# Patient Record
Sex: Female | Born: 1985 | Hispanic: Yes | Marital: Single | Smoking: Former smoker
Health system: Southern US, Community
[De-identification: ages and names within clinical notes are randomized; demographics above are authoritative.]

## PROBLEM LIST (undated history)

## (undated) ENCOUNTER — Inpatient Hospital Stay (HOSPITAL_COMMUNITY): Payer: Self-pay

## (undated) DIAGNOSIS — D649 Anemia, unspecified: Secondary | ICD-10-CM

## (undated) DIAGNOSIS — N739 Female pelvic inflammatory disease, unspecified: Secondary | ICD-10-CM

## (undated) DIAGNOSIS — A749 Chlamydial infection, unspecified: Secondary | ICD-10-CM

## (undated) DIAGNOSIS — A599 Trichomoniasis, unspecified: Secondary | ICD-10-CM

## (undated) HISTORY — PX: TUBAL LIGATION: SHX77

## (undated) HISTORY — PX: AMPUTATION FINGER: SHX6594

## (undated) HISTORY — PX: CHOLECYSTECTOMY: SHX55

---

## 2007-01-13 ENCOUNTER — Emergency Department (HOSPITAL_COMMUNITY): Admission: EM | Admit: 2007-01-13 | Discharge: 2007-01-13 | Payer: Self-pay | Admitting: *Deleted

## 2008-10-14 ENCOUNTER — Emergency Department (HOSPITAL_COMMUNITY): Admission: EM | Admit: 2008-10-14 | Discharge: 2008-10-14 | Payer: Self-pay | Admitting: Emergency Medicine

## 2009-03-28 ENCOUNTER — Emergency Department (HOSPITAL_COMMUNITY): Admission: EM | Admit: 2009-03-28 | Discharge: 2009-03-28 | Payer: Self-pay | Admitting: Emergency Medicine

## 2009-04-02 ENCOUNTER — Inpatient Hospital Stay (HOSPITAL_COMMUNITY): Admission: AD | Admit: 2009-04-02 | Discharge: 2009-04-02 | Payer: Self-pay | Admitting: Obstetrics & Gynecology

## 2009-04-21 ENCOUNTER — Inpatient Hospital Stay (HOSPITAL_COMMUNITY): Admission: AD | Admit: 2009-04-21 | Discharge: 2009-04-22 | Payer: Self-pay | Admitting: Obstetrics & Gynecology

## 2009-05-20 ENCOUNTER — Inpatient Hospital Stay (HOSPITAL_COMMUNITY): Admission: AD | Admit: 2009-05-20 | Discharge: 2009-05-20 | Payer: Self-pay | Admitting: Obstetrics & Gynecology

## 2009-06-05 ENCOUNTER — Ambulatory Visit: Payer: Self-pay | Admitting: Obstetrics & Gynecology

## 2009-06-05 ENCOUNTER — Encounter (INDEPENDENT_AMBULATORY_CARE_PROVIDER_SITE_OTHER): Payer: Self-pay | Admitting: Family Medicine

## 2009-06-05 ENCOUNTER — Encounter: Payer: Self-pay | Admitting: Family Medicine

## 2009-11-20 ENCOUNTER — Inpatient Hospital Stay (HOSPITAL_COMMUNITY): Admission: AD | Admit: 2009-11-20 | Discharge: 2009-11-20 | Payer: Self-pay | Admitting: Obstetrics & Gynecology

## 2009-11-27 ENCOUNTER — Ambulatory Visit (HOSPITAL_COMMUNITY): Admission: RE | Admit: 2009-11-27 | Discharge: 2009-11-27 | Payer: Self-pay | Admitting: Obstetrics & Gynecology

## 2009-11-29 ENCOUNTER — Inpatient Hospital Stay (HOSPITAL_COMMUNITY): Admission: AD | Admit: 2009-11-29 | Discharge: 2009-11-29 | Payer: Self-pay | Admitting: Obstetrics and Gynecology

## 2009-12-06 ENCOUNTER — Ambulatory Visit (HOSPITAL_COMMUNITY): Admission: RE | Admit: 2009-12-06 | Discharge: 2009-12-06 | Payer: Self-pay | Admitting: Obstetrics & Gynecology

## 2010-01-07 ENCOUNTER — Ambulatory Visit (HOSPITAL_COMMUNITY): Admission: RE | Admit: 2010-01-07 | Discharge: 2010-01-07 | Payer: Self-pay | Admitting: Obstetrics and Gynecology

## 2010-07-22 ENCOUNTER — Inpatient Hospital Stay (HOSPITAL_COMMUNITY)
Admission: AD | Admit: 2010-07-22 | Discharge: 2010-07-22 | Payer: Self-pay | Source: Home / Self Care | Admitting: Obstetrics & Gynecology

## 2010-07-24 ENCOUNTER — Inpatient Hospital Stay (HOSPITAL_COMMUNITY): Admission: AD | Admit: 2010-07-24 | Discharge: 2010-07-27 | Payer: Self-pay | Admitting: Obstetrics and Gynecology

## 2010-11-04 LAB — CBC
HCT: 25.9 % — ABNORMAL LOW (ref 36.0–46.0)
HCT: 27.3 % — ABNORMAL LOW (ref 36.0–46.0)
Hemoglobin: 11.4 g/dL — ABNORMAL LOW (ref 12.0–15.0)
Hemoglobin: 8.6 g/dL — ABNORMAL LOW (ref 12.0–15.0)
Hemoglobin: 9.2 g/dL — ABNORMAL LOW (ref 12.0–15.0)
MCHC: 33.7 g/dL (ref 30.0–36.0)
MCHC: 33.9 g/dL (ref 30.0–36.0)
MCHC: 33.9 g/dL (ref 30.0–36.0)
MCV: 87.1 fL (ref 78.0–100.0)
Platelets: 104 10*3/uL — ABNORMAL LOW (ref 150–400)
Platelets: 120 10*3/uL — ABNORMAL LOW (ref 150–400)
Platelets: 98 10*3/uL — ABNORMAL LOW (ref 150–400)
RBC: 2.89 MIL/uL — ABNORMAL LOW (ref 3.87–5.11)
RDW: 14.7 % (ref 11.5–15.5)
RDW: 14.7 % (ref 11.5–15.5)
RDW: 14.8 % (ref 11.5–15.5)
WBC: 12.8 10*3/uL — ABNORMAL HIGH (ref 4.0–10.5)
WBC: 8.4 10*3/uL (ref 4.0–10.5)

## 2010-11-04 LAB — GC/CHLAMYDIA PROBE AMP, URINE: Chlamydia, Swab/Urine, PCR: NEGATIVE

## 2010-11-12 LAB — HCG, QUANTITATIVE, PREGNANCY
hCG, Beta Chain, Quant, S: 4720 m[IU]/mL — ABNORMAL HIGH (ref <5)
hCG, Beta Chain, Quant, S: 9200 m[IU]/mL — ABNORMAL HIGH (ref <5)

## 2010-11-17 LAB — COMPREHENSIVE METABOLIC PANEL
ALT: 11 U/L (ref 0–35)
AST: 19 U/L (ref 0–37)
Albumin: 2.7 g/dL — ABNORMAL LOW (ref 3.5–5.2)
Alkaline Phosphatase: 131 U/L — ABNORMAL HIGH (ref 39–117)
Chloride: 105 mEq/L (ref 96–112)
GFR calc Af Amer: >60 mL/min (ref >60)
GFR calc non Af Amer: >60 mL/min (ref >60)
Sodium: 135 mEq/L (ref 135–145)
Total Bilirubin: 0.3 mg/dL (ref 0.3–1.2)
Total Protein: 5.6 g/dL — ABNORMAL LOW (ref 6.0–8.3)

## 2010-11-17 LAB — ABO/RH
ABO/RH(D): A POS
ABO/RH(D): A POS

## 2010-11-17 LAB — CBC
HCT: 36.2 % (ref 36.0–46.0)
HCT: 37.8 % (ref 36.0–46.0)
Hemoglobin: 12.3 g/dL (ref 12.0–15.0)
Platelets: 171 10*3/uL (ref 150–400)
RDW: 13.3 % (ref 11.5–15.5)
RDW: 13.8 % (ref 11.5–15.5)
WBC: 7 10*3/uL (ref 4.0–10.5)

## 2010-11-17 LAB — HCG, QUANTITATIVE, PREGNANCY: hCG, Beta Chain, Quant, S: 192 m[IU]/mL — ABNORMAL HIGH (ref <5)

## 2010-11-28 LAB — DIFFERENTIAL
Eosinophils Absolute: 0.1 10*3/uL (ref 0.0–0.7)
Lymphocytes Relative: 22 % (ref 12–46)
Lymphs Abs: 1.8 10*3/uL (ref 0.7–4.0)
Monocytes Relative: 7 % (ref 3–12)
Neutro Abs: 5.6 10*3/uL (ref 1.7–7.7)
Neutrophils Relative %: 70 % (ref 43–77)

## 2010-11-28 LAB — CBC
MCV: 91.7 fL (ref 78.0–100.0)
Platelets: 148 10*3/uL — ABNORMAL LOW (ref 150–400)
RBC: 3.98 MIL/uL (ref 3.87–5.11)
WBC: 8 10*3/uL (ref 4.0–10.5)

## 2010-11-29 LAB — URINALYSIS, ROUTINE W REFLEX MICROSCOPIC
Glucose, UA: NEGATIVE mg/dL
Protein, ur: 30 mg/dL — AB
Specific Gravity, Urine: 1.021 (ref 1.005–1.030)

## 2010-11-29 LAB — URINE MICROSCOPIC-ADD ON

## 2010-11-29 LAB — WET PREP, GENITAL

## 2010-11-29 LAB — GC/CHLAMYDIA PROBE AMP, GENITAL: GC Probe Amp, Genital: NEGATIVE

## 2010-11-30 LAB — URINALYSIS, ROUTINE W REFLEX MICROSCOPIC
Glucose, UA: NEGATIVE mg/dL
Protein, ur: NEGATIVE mg/dL
Specific Gravity, Urine: >1.03 — ABNORMAL HIGH (ref 1.005–1.030)
Urobilinogen, UA: 1 mg/dL (ref 0.0–1.0)

## 2010-11-30 LAB — CBC
Hemoglobin: 13.8 g/dL (ref 12.0–15.0)
MCHC: 33.8 g/dL (ref 30.0–36.0)
MCV: 89.7 fL (ref 78.0–100.0)
RBC: 4.53 MIL/uL (ref 3.87–5.11)

## 2010-11-30 LAB — WET PREP, GENITAL: Yeast Wet Prep HPF POC: NONE SEEN

## 2010-11-30 LAB — URINE MICROSCOPIC-ADD ON

## 2011-10-15 ENCOUNTER — Emergency Department (INDEPENDENT_AMBULATORY_CARE_PROVIDER_SITE_OTHER): Payer: Medicaid Other

## 2011-10-15 ENCOUNTER — Emergency Department (HOSPITAL_BASED_OUTPATIENT_CLINIC_OR_DEPARTMENT_OTHER)
Admission: EM | Admit: 2011-10-15 | Discharge: 2011-10-15 | Disposition: A | Discharge status: Home Health | Payer: Medicaid Other | Attending: Emergency Medicine | Admitting: Emergency Medicine

## 2011-10-15 ENCOUNTER — Encounter (HOSPITAL_BASED_OUTPATIENT_CLINIC_OR_DEPARTMENT_OTHER): Payer: Self-pay

## 2011-10-15 DIAGNOSIS — N9489 Other specified conditions associated with female genital organs and menstrual cycle: Secondary | ICD-10-CM | POA: Insufficient documentation

## 2011-10-15 DIAGNOSIS — F17200 Nicotine dependence, unspecified, uncomplicated: Secondary | ICD-10-CM | POA: Insufficient documentation

## 2011-10-15 DIAGNOSIS — N949 Unspecified condition associated with female genital organs and menstrual cycle: Secondary | ICD-10-CM

## 2011-10-15 DIAGNOSIS — R109 Unspecified abdominal pain: Secondary | ICD-10-CM | POA: Insufficient documentation

## 2011-10-15 DIAGNOSIS — R112 Nausea with vomiting, unspecified: Secondary | ICD-10-CM | POA: Insufficient documentation

## 2011-10-15 DIAGNOSIS — N83209 Unspecified ovarian cyst, unspecified side: Secondary | ICD-10-CM

## 2011-10-15 DIAGNOSIS — Z3201 Encounter for pregnancy test, result positive: Secondary | ICD-10-CM | POA: Insufficient documentation

## 2011-10-15 LAB — HCG, QUANTITATIVE, PREGNANCY: hCG, Beta Chain, Quant, S: 76 m[IU]/mL — ABNORMAL HIGH (ref <5)

## 2011-10-15 LAB — URINALYSIS, ROUTINE W REFLEX MICROSCOPIC
Hgb urine dipstick: NEGATIVE
Leukocytes, UA: NEGATIVE
Nitrite: NEGATIVE
Protein, ur: NEGATIVE mg/dL
Urobilinogen, UA: 1 mg/dL (ref 0.0–1.0)

## 2011-10-15 LAB — WET PREP, GENITAL

## 2011-10-15 LAB — PREGNANCY, URINE: Preg Test, Ur: POSITIVE — AB

## 2011-10-15 NOTE — ED Notes (Signed)
Pt reports she had IUD removed in January and hasnt had a period since then and isnt on any other birth control. Took a home pregnancy test yesterday and reports it looks like there is a faint positive

## 2011-10-15 NOTE — ED Provider Notes (Addendum)
History     CSN: [id number]  Arrival date & time 10/15/11  1242   First MD Initiated Contact with Patient 10/15/11 1309      Chief Complaint  Patient presents with  . Pelvic Pain    (Consider location/radiation/quality/duration/timing/severity/associated sxs/prior treatment) Patient is a 26 y.o. female presenting with pelvic pain. The history is provided by the patient.  Pelvic Pain This is a new (2 weeks ago) problem. The current episode started more than 1 week ago. The problem occurs constantly. The problem has been gradually worsening. Associated symptoms include abdominal pain. Pertinent negatives include no headaches. Associated symptoms comments: No fever but intermittent vomiting usually after eating. No diarrhea, vaginal discharge.. The symptoms are aggravated by nothing. The symptoms are relieved by nothing. She has tried nothing for the symptoms.    History reviewed. No pertinent past medical history.  History reviewed. No pertinent past surgical history.  No family history on file.  History  Substance Use Topics  . Smoking status: Current Some Day Smoker  . Smokeless tobacco: Not on file  . Alcohol Use: No    OB History    Grav Para Term Preterm Abortions TAB SAB Ect Mult Living                  Review of Systems  Constitutional: Negative for fever.  Gastrointestinal: Positive for nausea, vomiting and abdominal pain. Negative for constipation.  Genitourinary: Positive for pelvic pain. Negative for dysuria, urgency, vaginal bleeding and vaginal discharge.  Neurological: Negative for headaches.  All other systems reviewed and are negative.    Allergies  Review of patient's allergies indicates no known allergies.  Home Medications  No current outpatient prescriptions on file.  BP 119/64  Pulse 68  Temp(Src) 98.3 F (36.8 C) (Oral)  Resp 16  Ht 5\' 1"  (1.549 m)  Wt 209 lb (94.802 kg)  BMI 39.49 kg/m2  SpO2 100%  LMP 09/05/2011  Physical Exam    Nursing note and vitals reviewed. Constitutional: She is oriented to person, place, and time. She appears well-developed and well-nourished. No distress.  HENT:  Head: Normocephalic and atraumatic.  Mouth/Throat: Oropharynx is clear and moist.  Eyes: Conjunctivae and EOM are normal. Pupils are equal, round, and reactive to light.  Neck: Normal range of motion. Neck supple.  Cardiovascular: Normal rate, regular rhythm and intact distal pulses.   No murmur heard. Pulmonary/Chest: Effort normal and breath sounds normal. No respiratory distress. She has no wheezes. She has no rales.  Abdominal: Soft. She exhibits no distension. There is tenderness. There is no rebound and no guarding.    Genitourinary: Vagina normal and uterus normal. Uterus is not tender. Cervix exhibits no motion tenderness, no discharge and no friability. Right adnexum displays no mass, no tenderness and no fullness. Left adnexum displays no mass, no tenderness and no fullness.  Musculoskeletal: Normal range of motion. She exhibits no edema and no tenderness.  Neurological: She is alert and oriented to person, place, and time.  Skin: Skin is warm and dry. No rash noted. No erythema.  Psychiatric: She has a normal mood and affect. Her behavior is normal.    ED Course  Procedures (including critical care time)  Labs Reviewed  PREGNANCY, URINE - Abnormal; Notable for the following:    Preg Test, Ur POSITIVE (*)    All other components within normal limits  HCG, QUANTITATIVE, PREGNANCY - Abnormal; Notable for the following:    hCG, Beta Chain, Quant, S 76 (*)  All other components within normal limits  WET PREP, GENITAL - Abnormal; Notable for the following:    Clue Cells Wet Prep HPF POC MODERATE (*)    WBC, Wet Prep HPF POC MODERATE (*)    All other components within normal limits  URINALYSIS, ROUTINE W REFLEX MICROSCOPIC  GC/CHLAMYDIA PROBE AMP, GENITAL   No results found.   No diagnosis found.    MDM    Patient with 2 weeks of pelvic pain and nausea. Patient had her IUD removed in January and had one period mid-January. I had one in February. She is having unprotected sex but denies any vaginal discharge, new partners, or urinary symptoms. Pelvic exam is normal. May be within normal limits. UPT positive. Because unclear due to recent IUD removal how pregnant the patient is. HCG and pelvic ultrasound ordered.  Labs show early pregnancy. Transvaginal ultrasound was IVP at this time however there is also a cyst in the ovary but unclear whether it is an ectopic pregnancy or a luteal cyst.. No sign of UTI or bacterial infection at this time. Will have her follow up with OB for repeat Quant and ultrasound.       Gwyneth Sprout, MD 10/15/11 1541  Gwyneth Sprout, MD 10/15/11 1559

## 2011-10-15 NOTE — Discharge Instructions (Signed)
Human Chorionic Gonadotropin (hCG) This is a test to confirm and monitor pregnancy or to diagnose trophoblastic disease or germ cell tumors. As early as 10 days after a missed menstrual period (some methods can detect hCG even earlier, at one week after conception) or if your caregiver thinks that your symptoms suggest ectopic pregnancy, a failing pregnancy, trophoblastic disease, or germ cell tumors. hCG is a protein produced in the placenta of a pregnant woman. A pregnancy test is a specific blood or urine test that can detect hCG and confirm pregnancy. This hormone is able to be detected 10 days after a missed menstrual period, the time period when the fertilized egg is implanted in the woman's uterus. With some methods, hCG can be detected even earlier, at one week after conception.  During the early weeks of pregnancy, hCG is important in maintaining function of the corpus luteum (the mass of cells that forms from a mature egg). Production of hCG increases steadily during the first trimester (8-10 weeks), peaking around the 10th week after the last menstrual cycle. Levels then fall slowly during the remainder of the pregnancy. hCG is no longer detectable within a few weeks after delivery. hCG is also produced by some germ cell tumors and increased levels are seen in trophoblastic disease. SAMPLE COLLECTION hCG is commonly detected in urine. The preferred specimen is a random urine sample collected first thing in the morning. hCG can also be measured in blood drawn from a vein in the arm. NORMAL FINDINGS Qualitative:negative in non-pregnant women; positive in pregnancy Quantitative:   Gestation less than 1 week: 5-50 Whole HCG (milli-international units/mL)   Gestation of 2 weeks: 50-500 Whole HCG (milli-international units/mL)   Gestation of 3 weeks: 100-10,000 Whole HCG (milli-international units/mL)   Gestation of 4 weeks: 1,000-30,000 Whole HCG (milli-international units/mL)   Gestation of  5 weeks 3,500-115,000 Whole HCG (milli-international units/mL)   Gestation of 6-8 weeks: 12,000-270,000 Whole HCG (milli-international units/mL)   Gestation of 12 weeks: 15,000-220,000 Whole HCG (milli-international units/mL)   Males and non-pregnant females: less than 5 Whole HCG (milli-international units/mL)  Beta subunit: depends on the method and test used Ranges for normal findings may vary among different laboratories and hospitals. You should always check with your doctor after having lab work or other tests done to discuss the meaning of your test results and whether your values are considered within normal limits. MEANING OF TEST  Your caregiver will go over the test results with you and discuss the importance and meaning of your results, as well as treatment options and the need for additional tests if necessary. OBTAINING THE TEST RESULTS It is your responsibility to obtain your test results. Ask the lab or department performing the test when and how you will get your results. Document Released: 09/11/2004 Document Revised: 04/22/2011 Document Reviewed: 07/21/2008 [facility] Patient Information 2012 [place], [place].Prenatal Care  WHAT IS PRENATAL CARE?  Prenatal care means health care during your pregnancy, before your baby is born. Take care of yourself and your baby by:   Getting early prenatal care. If you know you are pregnant, or think you might be pregnant, call your caregiver as soon as possible. Schedule a visit for a general/prenatal examination.   Getting regular prenatal care. Follow your caregiver's schedule for blood and other necessary tests. Do not miss appointments.   Do everything you can to keep yourself and your baby healthy during your pregnancy.   Prenatal care should include evaluation of medical, dietary, educational, psychological, and  social needs for the couple and the medical, surgical, and genetic history of the family of the mother and father.    Discuss with your caregiver:   Your medicines, prescription, over-the-counter, and herbal medicines.   Substance abuse, alcohol, smoking, and illegal drugs.   Domestic abuse and violence, if present.   Your immunizations.   Nutrition and diet.   Exercising.   Environment and occupational hazards, at home and at work.   History of sexually transmitted disease, both you and your partner.   Previous pregnancies.  WHY IS PRENATAL CARE SO IMPORTANT?  By seeing you regularly, your caregiver has the chance to find problems early, so that they can be treated as soon as possible. Other problems might be prevented. Many studies have shown that early and regular prenatal care is important for the health of both mothers and their babies.  I AM THINKING ABOUT GETTING PREGNANT. HOW CAN I TAKE CARE OF MYSELF?  Taking care of yourself before you get pregnant helps you to have a healthy pregnancy. It also lowers your chances of having a baby born with a birth defect. Here are ways to take care of yourself before you get pregnant:   Eat healthy foods, exercise regularly (30 minutes per day for most days of the week is best), and get enough rest and sleep. Talk to your caregiver about what kinds of foods and exercises are best for you.   Take 400 micrograms (mcg) of folic acid (one of the B vitamins) every day. The best way to do this is to take a daily multivitamin pill that contains this amount of folic acid. Getting enough of the synthetic (manufactured) form of folic acid every day before you get pregnant and during early pregnancy can help prevent certain birth defects. Many breakfast cereals and other grain products have folic acid added to them, but only certain cereals contain 400 mcg of folic acid per serving. Check the label on your multivitamin or cereal to find the amount of folic acid in the food.   See your caregiver for a complete check up before getting pregnant. Make sure that you have  had all your immunization shots, especially for rubella ([place] measles). Rubella can cause serious birth defects. Chickenpox is another illness you want to avoid during pregnancy. If you have had chickenpox and rubella in the past, you should be immune to them.   Tell your caregiver about any prescription or non-prescription medicines (including herbal remedies) you are taking. Some medicines are not safe to take during pregnancy.   Stop smoking cigarettes, drinking alcohol, or taking illegal drugs. Ask your caregiver for help, if you need it. You can also get help with alcohol and drugs by talking with a member of your faith community, a counselor, or a trusted friend.   Discuss and treat any medical, social, or psychological problems before getting pregnant.   Discuss any history of genetic problems in the mother, father, and their families. Do genetic testing before getting pregnant, when possible.   Discuss any physical or emotional abuse with your caregiver.   Discuss with your caregiver if you might be exposed to harmful chemicals on your job or where you live.   Discuss with your caregiver if you think your job or the hours you work may be harmful and should be changed.   The father should be involved with the decision making and with all aspects of the pregnancy, labor, and delivery.   If you have medical  insurance, make sure you are covered for pregnancy.  I JUST FOUND OUT THAT I AM PREGNANT. HOW CAN I TAKE CARE OF MYSELF?  Here are ways to take care of yourself and the precious new life growing inside you:   Continue taking your multivitamin with 400 micrograms (mcg) of folic acid every day.   Get early and regular prenatal care. It does not matter if this is your first pregnancy or if you already have children. It is very important to see a caregiver during your pregnancy. Your caregiver will check at each visit to make sure that you and the baby are healthy. If there are any  problems, action can be taken right away to help you and the baby.   Eat a healthy diet that includes:   Fruits.   Vegetables.   Foods low in saturated fat.   Grains.   Calcium-rich foods.   Drink 6 to 8 glasses of liquids a day.   Unless your caregiver tells you not to, try to be physically active for 30 minutes, most days of the week. If you are pressed for time, you can get your activity in through 10 minute segments, three times a day.   If you smoke, drink alcohol, or use drugs, STOP. These can cause long-term damage to your baby. Talk with your caregiver about steps to take to stop smoking. Talk with a member of your faith community, a counselor, a trusted friend, or your caregiver if you are concerned about your alcohol or drug use.   Ask your caregiver before taking any medicine, even over-the-counter medicines. Some medicines are not safe to take during pregnancy.   Get plenty of rest and sleep.   Avoid hot tubs and saunas during pregnancy.   Do not have X-rays taken, unless absolutely necessary and with the recommendation of your caregiver. A lead shield can be placed on your abdomen, to protect the baby when X-rays are taken in other parts of the body.   Do not empty the cat litter when you are pregnant. It may contain a parasite that causes an infection called toxoplasmosis, which can cause birth defects. Also, use gloves when working in garden areas used by cats.   Do not eat uncooked or undercooked cheese, meats, or fish.   Stay away from toxic chemicals like:   Insecticides.   Solvents (some cleaners or paint thinners).   Lead.   Mercury.   Sexual relations may continue until the end of the pregnancy, unless you have a medical problem or there is a problem with the pregnancy and your caregiver tells you not to.   Do not wear high heel shoes, especially during the second half of the pregnancy. You can lose your balance and fall.   Do not take long trips,  unless absolutely necessary. Be sure to see your caregiver before going on the trip.   Do not sit in one position for more than 2 hours, when on a trip.   Take a copy of your medical records when going on a trip.   Know where there is a hospital in the city you are visiting, in case of an emergency.   Most dangerous household products will have pregnancy warnings on their labels. Ask your caregiver about products if you are unsure.   Limit or eliminate your caffeine intake from coffee, tea, sodas, medicines, and chocolate.   Many women continue working through pregnancy. Staying active might help you stay healthier. If you have  a question about the safety or the hours you work at your particular job, talk with your caregiver.   Get informed:   Read books.   Watch videos.   Go to childbirth classes for you and the father.   Talk with experienced moms.   Ask your caregiver about childbirth education classes for you and your partner. Classes can help you and your partner prepare for the birth of your baby.   Ask about a pediatrician (baby doctor) and methods and pain medicine for labor, delivery, and possible Cesarean delivery (C-section).  I AM NOT THINKING ABOUT GETTING PREGNANT RIGHT NOW, BUT HEARD THAT ALL WOMEN SHOULD TAKE FOLIC ACID EVERY DAY?  All women of childbearing age, with even a remote chance of getting pregnant, should try to make sure they get enough folic acid. Many pregnancies are not planned. Many women do not know they are actually pregnant early in their pregnancies, and certain birth defects happen in the very early part of pregnancy. Taking 400 micrograms (mcg) of folic acid every day will help prevent certain birth defects that happen in the early part of pregnancy. If a woman begins taking vitamin pills in the second or third month of pregnancy, it may be too late to prevent birth defects. Folic acid may also have other health benefits for women, besides preventing  birth defects.  HOW OFTEN SHOULD I SEE MY CAREGIVER DURING PREGNANCY?  Your caregiver will give you a schedule for your prenatal visits. You will have visits more often as you get closer to the end of your pregnancy. An average pregnancy lasts about 40 weeks.  A typical schedule includes visiting your caregiver:   About once each month, during your first 6 months of pregnancy.   Every 2 weeks, during the next 2 months.   Weekly in the last month, until the delivery date.  Your caregiver will probably want to see you more often if:  You are over 35.   Your pregnancy is high risk, because you have certain health problems or problems with the pregnancy, such as:   Diabetes.   High blood pressure.   The baby is not growing on schedule, according to the dates of the pregnancy.  Your caregiver will do special tests, to make sure you and the baby are not having any serious problems. WHAT HAPPENS DURING PRENATAL VISITS?   At your first prenatal visit, your caregiver will talk to you about you and your partner's health history and your family's health history, and will do a physical exam.   On your first visit, a physical exam will include checks of your blood pressure, height and weight, and an exam of your pelvic organs. Your caregiver will do a Pap test if you have not had one recently, and will do cultures of your cervix to make sure there is no infection.   At each visit, there will be tests of your blood, urine, blood pressure, weight, and checking the progress of the baby.   Your caregiver will be able to tell you when to expect that your baby will be born.   Each visit is also a chance for you to learn about staying healthy during pregnancy and for asking questions.   Discuss whether you will be breastfeeding.   At your later prenatal visits, your caregiver will check how you are doing and how the baby is developing. You may have a number of tests done as your pregnancy progresses.    Ultrasound exams  are often used to check on the baby's growth and health.   You may have more urine and blood tests, as well as special tests, if needed. These may include amniocentesis (examine fluid in the pregnancy sac), stress tests (check how baby responds to contractions), biophysical profile (measures fetus well-being). Your caregiver will explain the tests and why they are necessary.  I AM IN MY LATE THIRTIES, AND I WANT TO HAVE A CHILD NOW. SHOULD I DO ANYTHING SPECIAL?  As you get older, there is more chance of having a medical problem (high blood pressure), pregnancy problem (preeclampsia, problems with the placenta), miscarriage, or a baby born with a birth defect. However, most women in their late thirties and early forties have healthy babies. See your caregiver on a regular basis before you get pregnant and be sure to go for exams throughout your pregnancy. Your caregiver probably will want to do some special tests to check on you and your baby's health when you are pregnant.  Women today are often delaying having children until later in life, when they are in their thirties and forties. While many women in their thirties and forties have no difficulty getting pregnant, fertility does decline with age. For women over 40 who cannot get pregnant after 6 months of trying, it is recommended that they see their caregiver for a fertility evaluation. It is not uncommon to have trouble becoming pregnant or experience infertility (inability to become pregnant after trying for one year). If you think that you or your partner may be infertile, you can discuss this with your caregiver. He or she can recommend treatments such as drugs, surgery, or assisted reproductive technology.  Document Released: 08/13/2003 Document Revised: 04/22/2011 Document Reviewed: 07/10/2009 [facility] Patient Information 2012 [place], [place].

## 2011-10-15 NOTE — ED Notes (Signed)
Report received from Antony Odea, RN care assumed.  Pt is in ultrasound.

## 2011-10-15 NOTE — ED Notes (Signed)
C/o n/v, pelvic pain x 2 weeks-denies vaginal d/c and urinary s/s

## 2011-11-02 ENCOUNTER — Encounter (HOSPITAL_BASED_OUTPATIENT_CLINIC_OR_DEPARTMENT_OTHER): Payer: Self-pay

## 2011-11-02 ENCOUNTER — Inpatient Hospital Stay (HOSPITAL_BASED_OUTPATIENT_CLINIC_OR_DEPARTMENT_OTHER)
Admission: EM | Admit: 2011-11-02 | Discharge: 2011-11-03 | Disposition: A | Discharge status: Home / Self Care | Payer: Medicaid Other | Attending: Obstetrics & Gynecology | Admitting: Obstetrics & Gynecology

## 2011-11-02 DIAGNOSIS — Z3490 Encounter for supervision of normal pregnancy, unspecified, unspecified trimester: Secondary | ICD-10-CM

## 2011-11-02 DIAGNOSIS — O209 Hemorrhage in early pregnancy, unspecified: Secondary | ICD-10-CM

## 2011-11-02 DIAGNOSIS — Z331 Pregnant state, incidental: Secondary | ICD-10-CM

## 2011-11-02 DIAGNOSIS — R1032 Left lower quadrant pain: Secondary | ICD-10-CM

## 2011-11-02 HISTORY — DX: Anemia, unspecified: D64.9

## 2011-11-02 LAB — CBC
HCT: 33.1 % — ABNORMAL LOW (ref 36.0–46.0)
Hemoglobin: 11.5 g/dL — ABNORMAL LOW (ref 12.0–15.0)
MCH: 29.8 pg (ref 26.0–34.0)
MCHC: 34.7 g/dL (ref 30.0–36.0)
RDW: 12.5 % (ref 11.5–15.5)

## 2011-11-02 LAB — HCG, QUANTITATIVE, PREGNANCY: hCG, Beta Chain, Quant, S: 39634 m[IU]/mL — ABNORMAL HIGH (ref <5)

## 2011-11-02 LAB — URINALYSIS, ROUTINE W REFLEX MICROSCOPIC
Glucose, UA: NEGATIVE mg/dL
Leukocytes, UA: NEGATIVE
Protein, ur: NEGATIVE mg/dL

## 2011-11-02 MED ORDER — SODIUM CHLORIDE 0.9 % IV BOLUS (SEPSIS)
500.0000 mL | Freq: Once | INTRAVENOUS | Status: AC
  Administered 2011-11-02: via INTRAVENOUS

## 2011-11-02 NOTE — ED Notes (Signed)
Pelvic cart to bedside 

## 2011-11-02 NOTE — ED Notes (Signed)
Warm blanket given

## 2011-11-02 NOTE — ED Notes (Signed)
Pt reminded about NPO status

## 2011-11-02 NOTE — ED Notes (Signed)
Left lower pelvic pain-x 3 days-was seen here and advised pregnant

## 2011-11-02 NOTE — ED Provider Notes (Signed)
This chart was scribed for Molly Gaskins, MD by Wallis Mart. The patient was seen in room MH02/MH02 and the patient's care was started at 10:45 PM.    CSN: [id number]  Arrival date & time 11/02/11  2127   First MD Initiated Contact with Patient 11/02/11 2244      Chief Complaint  Patient presents with  . Pelvic Pain  . Routine Prenatal Visit     HPI  Pt seen at 10:45 PM Molly Mcknight is a 26 y.o. female who presents to the Emergency Department complaining of sudden onset, intermittent, gradually worsening, moderate left sided abdominal pain onset several days ago. Pt is pregnant but not sure how far along. Pain radiates nowhere and pt describes pain as burning and stinging. Pt takes tylenol w/ mild relief. Denies vaginal bleeding, dysuria, coughing, SOB, vomiting, problems w/ BM. Denies any abd surgeries. There are no other associated symptoms and no other alleviating or aggravating factors.  This is pt's third pregnancy, G3P1 LMP 09/05/2011 Past Medical History  Diagnosis Date  . Pregnant state, incidental     History reviewed. No pertinent past surgical history.  No family history on file.  History  Substance Use Topics  . Smoking status: Current Some Day Smoker  . Smokeless tobacco: Not on file  . Alcohol Use: No    OB History    Grav Para Term Preterm Abortions TAB SAB Ect Mult Living   3 1 1  1  1          Review of Systems  Constitutional: Negative for fever.  HENT: Negative for rhinorrhea.   Eyes: Negative for pain.  Respiratory: Negative for cough and shortness of breath.   Cardiovascular: Negative for chest pain.  Gastrointestinal: Positive for abdominal pain. Negative for nausea, vomiting and diarrhea.  Genitourinary: Negative for dysuria and vaginal bleeding.  Musculoskeletal: Negative for back pain.  Skin: Negative for rash.  Neurological: Negative for weakness and headaches.  All other systems reviewed and are negative.    Allergies    Review of patient's allergies indicates no known allergies.  Home Medications   Current Outpatient Rx  Name Route Sig Dispense Refill  . PRENATAL MULTIVITAMIN CH Oral Take 1 tablet by mouth daily.      BP 120/61  Pulse 65  Temp(Src) 98.1 F (36.7 C) (Oral)  Resp 16  Ht 5\' 2"  (1.575 m)  Wt 220 lb (99.791 kg)  BMI 40.24 kg/m2  SpO2 100%  LMP 09/05/2011  Physical Exam CONSTITUTIONAL: Well developed/well nourished HEAD AND FACE: Normocephalic/atraumatic EYES: EOMI/PERRL ENMT: Mucous membranes moist NECK: supple no meningeal signs CV: S1/S2 noted, no murmurs/rubs/gallops noted LUNGS: Lungs are clear to auscultation bilaterally, no apparent distress ABDOMEN: soft, nontender, no rebound or guarding GU:no cva tenderness No cmt Cervical os closed.  No vaginal bleeding.  No adnexal tenderness/mass noted Chaperone present during exam NEURO: Pt is awake/alert, moves all extremitiesx4 EXTREMITIES: pulses normal, full ROM SKIN: warm, color normal PSYCH: no abnormalities of mood noted  ED Course  Procedures  DIAGNOSTIC STUDIES: Oxygen Saturation is 100% on room air, normal by my interpretation.    COORDINATION OF CARE:     Labs Reviewed  URINALYSIS, ROUTINE W REFLEX MICROSCOPIC  CBC  TYPE AND SCREEN  HCG, QUANTITATIVE, PREGNANCY   11:00 PM Last seen in ED in February, had inconclusive [place] at that time with low quant Has not seen GYN Since then Now has pelvic pain, no vag bleeding  11:41 PM Unable to visualize  pregnancy by TA ultrasound Will need transfer to [facility] hospital to evaluate for ectopic Pt stable  11:51 PM D/w dr legget at [facility] Will accept in transfer to [facility] for ultrasound     MDM  Nursing notes reviewed and considered in documentation All labs/vitals reviewed and considered Previous records reviewed and considered    I personally performed the services described in this documentation, which was scribed in my presence. The recorded  information has been reviewed and considered.        Molly Gaskins, MD 11/02/11 2352

## 2011-11-02 NOTE — ED Notes (Signed)
MD at bedside. 

## 2011-11-03 ENCOUNTER — Encounter (HOSPITAL_COMMUNITY): Payer: Self-pay | Admitting: *Deleted

## 2011-11-03 ENCOUNTER — Inpatient Hospital Stay (HOSPITAL_COMMUNITY): Payer: Medicaid Other

## 2011-11-03 DIAGNOSIS — R109 Unspecified abdominal pain: Secondary | ICD-10-CM

## 2011-11-03 NOTE — Discharge Instructions (Signed)
ABCs of Pregnancy A Antepartum care is very important. Be sure you see your doctor and get prenatal care as soon as you think you are pregnant. At this time, you will be tested for infection, genetic abnormalities and potential problems with you and the pregnancy. This is the time to discuss diet, exercise, work, medications, labor, pain medication during labor and the possibility of a cesarean delivery. Ask any questions that may concern you. It is important to see your doctor regularly throughout your pregnancy. Avoid exposure to toxic substances and chemicals - such as cleaning solvents, lead and mercury, some insecticides, and paint. Pregnant women should avoid exposure to paint fumes, and fumes that cause you to feel ill, dizzy or faint. When possible, it is a good idea to have a pre-pregnancy consultation with your caregiver to begin some important recommendations your caregiver suggests such as, taking folic acid, exercising, quitting smoking, avoiding alcoholic beverages, etc. B Breastfeeding is the healthiest choice for both you and your baby. It has many nutritional benefits for the baby and health benefits for the mother. It also creates a very tight and loving bond between the baby and mother. Talk to your doctor, your family and friends, and your employer about how you choose to feed your baby and how they can support you in your decision. Not all birth defects can be prevented, but a woman can take actions that may increase her chance of having a healthy baby. Many birth defects happen very early in pregnancy, sometimes before a woman even knows she is pregnant. Birth defects or abnormalities of any child in your or the father's family should be discussed with your caregiver. Get a good support bra as your breast size changes. Wear it especially when you exercise and when nursing.  C Celebrate the news of your pregnancy with the your spouse/father and family. Childbirth classes are helpful to  take for you and the spouse/father because it helps to understand what happens during the pregnancy, labor and delivery. Cesarean delivery should be discussed with your doctor so you are prepared for that possibility. The pros and cons of circumcision if it is a boy, should be discussed with your pediatrician. Cigarette smoking during pregnancy can result in low birth weight babies. It has been associated with infertility, miscarriages, tubal pregnancies, infant death (mortality) and poor health (morbidity) in childhood. Additionally, cigarette smoking may cause long-term learning disabilities. If you smoke, you should try to quit before getting pregnant and not smoke during the pregnancy. Secondary smoke may also harm a mother and her developing baby. It is a good idea to ask people to stop smoking around you during your pregnancy and after the baby is born. Extra calcium is necessary when you are pregnant and is found in your prenatal vitamin, in dairy products, green leafy vegetables and in calcium supplements. D A healthy diet according to your current weight and height, along with vitamins and mineral supplements should be discussed with your caregiver. Domestic abuse or violence should be made known to your doctor right away to get the situation corrected. Drink more water when you exercise to keep hydrated. Discomfort of your back and legs usually develops and progresses from the middle of the second trimester through to delivery of the baby. This is because of the enlarging baby and uterus, which may also affect your balance. Do not take illegal drugs. Illegal drugs can seriously harm the baby and you. Drink extra fluids (water is best) throughout pregnancy to help   your body keep up with the increases in your blood volume. Drink at least 6 to 8 glasses of water, fruit juice, or milk each day. A good way to know you are drinking enough fluid is when your urine looks almost like clear water or is very light  yellow.  E Eat healthy to get the nutrients you and your unborn baby need. Your meals should include the five basic food groups. Exercise (30 minutes of light to moderate exercise a day) is important and encouraged during pregnancy, if there are no medical problems or problems with the pregnancy. Exercise that causes discomfort or dizziness should be stopped and reported to your caregiver. Emotions during pregnancy can change from being ecstatic to depression and should be understood by you, your partner and your family. F Fetal screening with ultrasound, amniocentesis and monitoring during pregnancy and labor is common and sometimes necessary. Take 400 micrograms of folic acid daily both before, when possible, and during the first few months of pregnancy to reduce the risk of birth defects of the brain and spine. All women who could possibly become pregnant should take a vitamin with folic acid, every day. It is also important to eat a healthy diet with fortified foods (enriched grain products, including cereals, rice, breads, and pastas) and foods with natural sources of folate (orange juice, green leafy vegetables, beans, peanuts, broccoli, asparagus, peas, and lentils). The father should be involved with all aspects of the pregnancy including, the prenatal care, childbirth classes, labor, delivery, and postpartum time. Fathers may also have emotional concerns about being a father, financial needs, and raising a family. G Genetic testing should be done appropriately. It is important to know your family and the father's history. If there have been problems with pregnancies or birth defects in your family, report these to your doctor. Also, genetic counselors can talk with you about the information you might need in making decisions about having a family. You can call a major medical center in your area for help in finding a board-certified genetic counselor. Genetic testing and counseling should be done  before pregnancy when possible, especially if there is a history of problems in the mother's or father's family. Certain ethnic backgrounds are more at risk for genetic defects. H Get familiar with the hospital where you will be having your baby. Get to know how long it takes to get there, the labor and delivery area, and the hospital procedures. Be sure your medical insurance is accepted there. Get your home ready for the baby including, clothes, the baby's room (when possible), furniture and car seat. Hand washing is important throughout the day, especially after handling raw meat and poultry, changing the baby's diaper or using the bathroom. This can help prevent the spread of many bacteria and viruses that cause infection. Your hair may become dry and thinner, but will return to normal a few weeks after the baby is born. Heartburn is a common problem that can be treated by taking antacids recommended by your caregiver, eating smaller meals 5 or 6 times a day, not drinking liquids when eating, drinking between meals and raising the head of your bed 2 to 3 inches. I Insurance to cover you, the baby, doctor and hospital should be reviewed so that you will be prepared to pay any costs not covered by your insurance plan. If you do not have medical insurance, there are usually clinics and services available for you in your community. Take 30 milligrams of iron during   your pregnancy as prescribed by your doctor to reduce the risk of low red blood cells (anemia) later in pregnancy. All women of childbearing age should eat a diet rich in iron. J There should be a joint effort for the mother, father and any other children to adapt to the pregnancy financially, emotionally, and psychologically during the pregnancy. Join a support group for moms-to-be. Or, join a class on parenting or childbirth. Have the family participate when possible. K Know your limits. Let your caregiver know if you experience any of the  following:   Pain of any kind.   Strong cramps.   You develop a lot of weight in a short period of time (5 pounds in 3 to 5 days).   Vaginal bleeding, leaking of amniotic fluid.   Headache, vision problems.   Dizziness, fainting, shortness of breath.   Chest pain.   Fever of 102 F (38.9 C) or higher.   Gush of clear fluid from your vagina.   Painful urination.   Domestic violence.   Irregular heartbeat (palpitations).   Rapid beating of the heart (tachycardia).   Constant feeling sick to your stomach (nauseous) and vomiting.   Trouble walking, fluid retention (edema).   Muscle weakness.   If your baby has decreased activity.   Persistent diarrhea.   Abnormal vaginal discharge.   Uterine contractions at 20-minute intervals.   Back pain that travels down your leg.  L Learn and practice that what you eat and drink should be in moderation and healthy for you and your baby. Legal drugs such as alcohol and caffeine are important issues for pregnant women. There is no safe amount of alcohol a woman can drink while pregnant. Fetal alcohol syndrome, a disorder characterized by growth retardation, facial abnormalities, and central nervous system dysfunction, is caused by a woman's use of alcohol during pregnancy. Caffeine, found in tea, coffee, soft drinks and chocolate, should also be limited. Be sure to read labels when trying to cut down on caffeine during pregnancy. More than 200 foods, beverages, and over-the-counter medications contain caffeine and have a high salt content! There are coffees and teas that do not contain caffeine. M Medical conditions such as diabetes, epilepsy, and high blood pressure should be treated and kept under control before pregnancy when possible, but especially during pregnancy. Ask your caregiver about any medications that may need to be changed or adjusted during pregnancy. If you are currently taking any medications, ask your caregiver if it  is safe to take them while you are pregnant or before getting pregnant when possible. Also, be sure to discuss any herbs or vitamins you are taking. They are medicines, too! Discuss with your doctor all medications, prescribed and over-the-counter, that you are taking. During your prenatal visit, discuss the medications your doctor may give you during labor and delivery. N Never be afraid to ask your doctor or caregiver questions about your health, the progress of the pregnancy, family problems, stressful situations, and recommendation for a pediatrician, if you do not have one. It is better to take all precautions and discuss any questions or concerns you may have during your office visits. It is a good idea to write down your questions before you visit the doctor. O Over-the-counter cough and cold remedies may contain alcohol or other ingredients that should be avoided during pregnancy. Ask your caregiver about prescription, herbs or over-the-counter medications that you are taking or may consider taking while pregnant.  P Physical activity during pregnancy can   benefit both you and your baby by lessening discomfort and fatigue, providing a sense of well-being, and increasing the likelihood of early recovery after delivery. Light to moderate exercise during pregnancy strengthens the belly (abdominal) and back muscles. This helps improve posture. Practicing yoga, walking, swimming, and cycling on a stationary bicycle are usually safe exercises for pregnant women. Avoid scuba diving, exercise at high altitudes (over 3000 feet), skiing, horseback riding, contact sports, etc. Always check with your doctor before beginning any kind of exercise, especially during pregnancy and especially if you did not exercise before getting pregnant. Q Queasiness, stomach upset and morning sickness are common during pregnancy. Eating a couple of crackers or dry toast before getting out of bed. Foods that you normally love may  make you feel sick to your stomach. You may need to substitute other nutritious foods. Eating 5 or 6 small meals a day instead of 3 large ones may make you feel better. Do not drink with your meals, drink between meals. Questions that you have should be written down and asked during your prenatal visits. R Read about and make plans to baby-proof your home. There are important tips for making your home a safer environment for your baby. Review the tips and make your home safer for you and your baby. Read food labels regarding calories, salt and fat content in the food. S Saunas, hot tubs, and steam rooms should be avoided while you are pregnant. Excessive high heat may be harmful during your pregnancy. Your caregiver will screen and examine you for sexually transmitted diseases and genetic disorders during your prenatal visits. Learn the signs of labor. Sexual relations while pregnant is safe unless there is a medical or pregnancy problem and your caregiver advises against it. T Traveling long distances should be avoided especially in the third trimester of your pregnancy. If you do have to travel out of state, be sure to take a copy of your medical records and medical insurance plan with you. You should not travel long distances without seeing your doctor first. Most airlines will not allow you to travel after 36 weeks of pregnancy. Toxoplasmosis is an infection caused by a parasite that can seriously harm an unborn baby. Avoid eating undercooked meat and handling cat litter. Be sure to wear gloves when gardening. Tingling of the hands and fingers is not unusual and is due to fluid retention. This will go away after the baby is born. U Womb (uterus) size increases during the first trimester. Your kidneys will begin to function more efficiently. This may cause you to feel the need to urinate more often. You may also leak urine when sneezing, coughing or laughing. This is due to the growing uterus pressing  against your bladder, which lies directly in front of and slightly under the uterus during the first few months of pregnancy. If you experience burning along with frequency of urination or bloody urine, be sure to tell your doctor. The size of your uterus in the third trimester may cause a problem with your balance. It is advisable to maintain good posture and avoid wearing high heels during this time. An ultrasound of your baby may be necessary during your pregnancy and is safe for you and your baby. V Vaccinations are an important concern for pregnant women. Get needed vaccines before pregnancy. Center for Disease Control (www.cdc.gov) has clear guidelines for the use of vaccines during pregnancy. Review the list, be sure to discuss it with your doctor. Prenatal vitamins are helpful   and healthy for you and the baby. Do not take extra vitamins except what is recommended. Taking too much of certain vitamins can cause overdose problems. Continuous vomiting should be reported to your caregiver. Varicose veins may appear especially if there is a family history of varicose veins. They should subside after the delivery of the baby. Support hose helps if there is leg discomfort. W Being overweight or underweight during pregnancy may cause problems. Try to get within 15 pounds of your ideal weight before pregnancy. Remember, pregnancy is not a time to be dieting! Do not stop eating or start skipping meals as your weight increases. Both you and your baby need the calories and nutrition you receive from a healthy diet. Be sure to consult with your doctor about your diet. There is a formula and diet plan available depending on whether you are overweight or underweight. Your caregiver or nutritionist can help and advise you if necessary. X Avoid X-rays. If you must have dental work or diagnostic tests, tell your dentist or physician that you are pregnant so that extra care can be taken. X-rays should only be taken when  the risks of not taking them outweigh the risk of taking them. If needed, only the minimum amount of radiation should be used. When X-rays are necessary, protective lead shields should be used to cover areas of the body that are not being X-rayed. Y Your baby loves you. Breastfeeding your baby creates a loving and very close bond between the two of you. Give your baby a healthy environment to live in while you are pregnant. Infants and children require constant care and guidance. Their health and safety should be carefully watched at all times. After the baby is born, rest or take a nap when the baby is sleeping. Z Get your ZZZs. Be sure to get plenty of rest. Resting on your side as often as possible, especially on your left side is advised. It provides the best circulation to your baby and helps reduce swelling. Try taking a nap for 30 to 45 minutes in the afternoon when possible. After the baby is born rest or take a nap when the baby is sleeping. Try elevating your feet for that amount of time when possible. It helps the circulation in your legs and helps reduce swelling.  Most information courtesy of the CDC. Document Released: 08/10/2005 Document Revised: 07/30/2011 Document Reviewed: 04/24/2009 [employer] Patient Information 2012 [employer]. 

## 2011-11-03 NOTE — Progress Notes (Signed)
*  RADIOLOGY REPORT*  Assumed care from [facility].  Awaiting [place] to confirm Location of pregnancy  Clinical Data: Bleeding, left lower quadrant pain.  TRANSVAGINAL OB ULTRASOUND  Technique: Transvaginal ultrasound was performed for evaluation of  the gestation as well as the maternal uterus and adnexal regions.  Comparison: 10/15/2011  Findings: Single intrauterine gestational sac identified. There is  a yolk sac and an embryo with cardiac activity documented. Heart  rate of 110 beats per minute.  Measures 5.3 mm by crown-rump length. Estimated age of 6 weeks 3  days, [facility] 06/25/2012.  No subchorionic hemorrhage. Trace free fluid.  Normal sonographic appearance to the ovaries. Corpus luteal cyst  is noted on the right.  IMPRESSION:  Intrauterine gestation identified with cardiac activity documented.  Estimated age of 6 weeks 3 days by crown-rump length.  Original Report Authenticated By: Waneta Martins, M.D.  Reviewed with patient WIll release to Prenatal Care

## 2011-11-03 NOTE — MAU Note (Signed)
Molly Mcknight, CNM in to see pt.  Assessment done and [place] results discussed with pt. Marland Kitchen

## 2011-11-03 NOTE — ED Notes (Signed)
Pt report given to Haywood Lasso, Charity fundraiser at [employer]

## 2011-11-03 NOTE — MAU Note (Signed)
Pt arrived from [facility] in [place]-needs an US-pelvic done there-[id number]

## 2012-02-18 ENCOUNTER — Encounter (HOSPITAL_COMMUNITY): Payer: Self-pay | Admitting: *Deleted

## 2012-02-18 ENCOUNTER — Inpatient Hospital Stay (HOSPITAL_COMMUNITY)
Admission: AD | Admit: 2012-02-18 | Discharge: 2012-02-19 | Disposition: A | Discharge status: Home / Self Care | Payer: Medicaid Other | Source: Ambulatory Visit | Attending: Family Medicine | Admitting: Family Medicine

## 2012-02-18 DIAGNOSIS — N898 Other specified noninflammatory disorders of vagina: Secondary | ICD-10-CM

## 2012-02-18 DIAGNOSIS — N949 Unspecified condition associated with female genital organs and menstrual cycle: Secondary | ICD-10-CM | POA: Insufficient documentation

## 2012-02-18 DIAGNOSIS — O26859 Spotting complicating pregnancy, unspecified trimester: Secondary | ICD-10-CM | POA: Insufficient documentation

## 2012-02-18 DIAGNOSIS — N939 Abnormal uterine and vaginal bleeding, unspecified: Secondary | ICD-10-CM

## 2012-02-18 DIAGNOSIS — R109 Unspecified abdominal pain: Secondary | ICD-10-CM | POA: Insufficient documentation

## 2012-02-18 LAB — URINALYSIS, ROUTINE W REFLEX MICROSCOPIC
Bilirubin Urine: NEGATIVE
Hgb urine dipstick: NEGATIVE
Specific Gravity, Urine: 1.015 (ref 1.005–1.030)
pH: 7.5 (ref 5.0–8.0)

## 2012-02-18 LAB — WET PREP, GENITAL
Clue Cells Wet Prep HPF POC: NONE SEEN
Trich, Wet Prep: NONE SEEN

## 2012-02-18 NOTE — MAU Note (Signed)
Pt G3 P1 at 21.2wks having lower abd pain and pressure.  Clear mucous discharge today, cloudy urine with an odor.

## 2012-02-18 NOTE — MAU Provider Note (Signed)
Chief Complaint:  Abdominal Pain and Vaginal Discharge   First Provider Initiated Contact with Patient 02/18/12 2239      HPI  Molly Mcknight is a 26 y.o. G3P1011 at [redacted]w[redacted]d presenting with constant lower abdominal pain, vaginal pain, and thick white vaginal discharge and brown spotting when wiping.  Pt reports at her u/s 2 weeks ago they told her the placenta might be over her cervix but was not given instructions for pelvic rest.  Last sexual intercourse >1 week ago.  She received care in [facility] but has moved to [place] and plans to transfer care to [facility].  She reports good fetal movement, denies LOF, urinary symptoms, h/a, dizziness, n/v, or fever/chills.     Pregnancy Course: uncomplicated  Past Medical History: Past Medical History  Diagnosis Date  . Pregnant state, incidental   . Anemia     Past Surgical History: History reviewed. No pertinent past surgical history.  Family History: History reviewed. No pertinent family history.  Social History: History  Substance Use Topics  . Smoking status: Current Some Day Smoker  . Smokeless tobacco: Not on file  . Alcohol Use: No    Allergies: No Known Allergies  Meds:  Prescriptions prior to admission  Medication Sig Dispense Refill  . acetaminophen (TYLENOL) 500 MG tablet Take 500 mg by mouth every 6 (six) hours as needed. Back pain      . Prenatal Vit-Fe Fumarate-FA (PRENATAL MULTIVITAMIN) TABS Take 1 tablet by mouth daily.          Physical Exam  Blood pressure 138/70, pulse 72, temperature 98.3 F (36.8 C), temperature source Oral, resp. rate 18, height 5\' 1"  (1.549 m), weight 99.882 kg (220 lb 3.2 oz), last menstrual period 09/05/2011. GENERAL: Well-developed, well-nourished female in no acute distress.  HEENT: normocephalic, good dentition HEART: normal rate RESP: normal effort ABDOMEN: Soft, nontender, gravid appropriate for gestational age EXTREMITIES: Nontender, no edema NEURO: alert and  oriented  SPECULUM EXAM: Deferred until after U/S Wet prep collected externally, no bleeding noted externally, pt denies bleeding when wiping in MAU  Pelvic exam: Cervix pink, visually closed, without lesion, scant white creamy discharge, no bleeding noted, vaginal walls and external genitalia normal Cervical exam deferred.  Cervix closed, 4.3 cm length on U/S    FHT:  165 by doppler   Results for orders placed during the hospital encounter of 02/18/12 (from the past 24 hour(s))  URINALYSIS, ROUTINE W REFLEX MICROSCOPIC     Status: Normal   Collection Time   02/18/12  9:43 PM      Component Value Range   Color, Urine YELLOW  YELLOW   APPearance CLEAR  CLEAR   Specific Gravity, Urine 1.015  1.005 - 1.030   pH 7.5  5.0 - 8.0   Glucose, UA NEGATIVE  NEGATIVE mg/dL   Hgb urine dipstick NEGATIVE  NEGATIVE   Bilirubin Urine NEGATIVE  NEGATIVE   Ketones, ur NEGATIVE  NEGATIVE mg/dL   Protein, ur NEGATIVE  NEGATIVE mg/dL   Urobilinogen, UA 0.2  0.0 - 1.0 mg/dL   Nitrite NEGATIVE  NEGATIVE   Leukocytes, UA NEGATIVE  NEGATIVE  WET PREP, GENITAL     Status: Abnormal   Collection Time   02/18/12 10:30 PM      Component Value Range   Yeast Wet Prep HPF POC NONE SEEN  NONE SEEN   Trich, Wet Prep NONE SEEN  NONE SEEN   Clue Cells Wet Prep HPF POC NONE SEEN  NONE SEEN   WBC, Wet Prep HPF POC FEW (*) NONE SEEN    Imaging:  US Ob Limited  02/19/2012  *RADIOLOGY REPORT*  Clinical Data: Pain and spotting.  History of placenta previa on previous anatomy scan.  Estimated gestational age by LMP is 21 weeks 3 days.  LIMITED OBSTETRIC ULTRASOUND  Number of Fetuses: 1 Heart Rate: 165 bpm Movement: Fetal motion is visualized. Presentation: Fetus is in variable presentation during scanning. Placental Location: The placenta is anterior.  There is no evidence of significant placenta previa. Previa: No placenta previa. Amniotic Fluid (Subjective): Normal  Vertical pocket:  4.7cm  BPD: 5.2cm   21w   6d   EDC:  06/25/2012  MATERNAL FINDINGS: Cervix: Cervical length is measured at 4.3 cm.  The cervix appears closed.  No funneling. Uterus/Adnexae: Uterus is incompletely visualized.  The right ovary measures 3.6 x 1.9 x 2.4 cm.  No abnormal adnexal masses.  The left ovary is not visualized.  IMPRESSION: Single intrauterine pregnancy with variable presentation is demonstrated.  Cervix is closed and measures 4.3 cm length. Placenta is anterior without obvious previa.  Recommend followup with non-emergent complete OB 14+ wk US examination for fetal biometric evaluation and anatomic survey if not already performed.  Original Report Authenticated By: Marlon Pel, M.D.   US Ob Transvaginal  02/19/2012  *RADIOLOGY REPORT*  Clinical Data: Pain and spotting.  History of placenta previa on previous anatomy scan.  Estimated gestational age by LMP is 21 weeks 3 days.  LIMITED OBSTETRIC ULTRASOUND  Number of Fetuses: 1 Heart Rate: 165 bpm Movement: Fetal motion is visualized. Presentation: Fetus is in variable presentation during scanning. Placental Location: The placenta is anterior.  There is no evidence of significant placenta previa. Previa: No placenta previa. Amniotic Fluid (Subjective): Normal  Vertical pocket:  4.7cm  BPD: 5.2cm   21w   6d   EDC: 06/25/2012  MATERNAL FINDINGS: Cervix: Cervical length is measured at 4.3 cm.  The cervix appears closed.  No funneling. Uterus/Adnexae: Uterus is incompletely visualized.  The right ovary measures 3.6 x 1.9 x 2.4 cm.  No abnormal adnexal masses.  The left ovary is not visualized.  IMPRESSION: Single intrauterine pregnancy with variable presentation is demonstrated.  Cervix is closed and measures 4.3 cm length. Placenta is anterior without obvious previa.  Recommend followup with non-emergent complete OB 14+ wk US examination for fetal biometric evaluation and anatomic survey if not already performed.  Original Report Authenticated By: Marlon Pel, M.D.     Assessment: Vaginal spotting Vaginal discharge   Plan: Limited OB U/S to evaluate placenta  D/C home with bleeding precautions F/U with prenatal provider GC/Chlamydia pending List of providers given for [facility] area Return to MAU as needed  LEFTWICH-KIRBY, Frankie Scipio 6/27/201310:51 PM

## 2012-02-19 ENCOUNTER — Inpatient Hospital Stay (HOSPITAL_COMMUNITY): Payer: Medicaid Other

## 2012-02-19 DIAGNOSIS — N898 Other specified noninflammatory disorders of vagina: Secondary | ICD-10-CM

## 2012-02-19 NOTE — Discharge Instructions (Signed)
Vaginal Bleeding During Pregnancy, Second Trimester  A small amount of bleeding (spotting) is relatively common in pregnancy. It usually stops on its own. There are many causes for bleeding or spotting in pregnancy. Some bleeding may be related to the pregnancy and some may not. Cramping with the bleeding is more serious and concerning. Tell your caregiver if you have any vaginal bleeding.   CAUSES    Infection, inflammation or growths on the cervix.   The placenta may partially or completely be covering the opening of the cervix inside the uterus.   The placenta may have separated from the uterus.   You may be having early/preterm labor.   The cervix is not strong enough to keep a baby inside the uterus (cervical insufficiency).   Many tiny cysts in the uterus instead of pregnancy tissue (molar pregnancy)  SYMPTOMS    Vaginal spotting or bleeding with or without cramps.   Uterine contractions.   Abnormal vaginal discharge.   You may have spotting or spotting after having sexual intercourse.  DIAGNOSIS   To evaluate the pregnancy, your caregiver may:   Do a pelvic exam.   Take blood tests.   Do an ultrasound.  It is very important to follow your caregiver's instructions.   TREATMENT    Evaluation of the pregnancy with blood tests and ultrasound.   Bed rest (getting up to use the bathroom only).   Rho-gam immunization if the mother is Rh negative and the father is Rh positive.   If you are having uterine contractions, you may be given medication to stop the contractions.   If you have cervical insufficiency, you may have a suture placed in the cervix to close it.  HOME CARE INSTRUCTIONS    If your caregiver orders bed rest, you may need to make arrangements for the care of other children and for any other responsibilities. However, your caregiver may allow you to continue light activity.   Keep track of the number of pads you use each day and how soaked (saturated) they are. Write this down.   Do  not use tampons. Do not douche.   Do not have sexual intercourse or orgasms until approved by your physician.   Save any tissue that you pass for your caregiver to see.   Take medicine for cramps only with your caregiver's permission.   Do not take aspirin because it can make you bleed.   Do not exercise, do any strenuous activities or heavy lifting without your caregiver's permission.  SEEK IMMEDIATE MEDICAL CARE IF:    You experience severe cramps in your stomach, back or belly (abdomen).   You have uterine contractions.   You have an oral temperature above 102 F (38.9 C), not controlled by medicine.   You develop chills.   You pass large clots or tissue.   Your bleeding increases or you become light-headed, weak or have fainting episodes.   You have leaking or a gush of fluid from your vagina.  Document Released: 05/20/2005 Document Revised: 07/30/2011 Document Reviewed: 11/29/2008  Exit[employer] Patient Information 2012 [employer]

## 2012-02-19 NOTE — MAU Provider Note (Signed)
Chart reviewed and agree with management and plan.  

## 2012-02-20 LAB — GC/CHLAMYDIA PROBE AMP, GENITAL
Chlamydia, DNA Probe: NEGATIVE
GC Probe Amp, Genital: NEGATIVE

## 2012-10-28 ENCOUNTER — Encounter (HOSPITAL_BASED_OUTPATIENT_CLINIC_OR_DEPARTMENT_OTHER): Payer: Self-pay | Admitting: Emergency Medicine

## 2012-10-28 ENCOUNTER — Emergency Department (HOSPITAL_BASED_OUTPATIENT_CLINIC_OR_DEPARTMENT_OTHER)
Admission: EM | Admit: 2012-10-28 | Discharge: 2012-10-28 | Disposition: A | Discharge status: Home / Self Care | Payer: Self-pay | Attending: Emergency Medicine | Admitting: Emergency Medicine

## 2012-10-28 DIAGNOSIS — F17200 Nicotine dependence, unspecified, uncomplicated: Secondary | ICD-10-CM | POA: Insufficient documentation

## 2012-10-28 DIAGNOSIS — Z862 Personal history of diseases of the blood and blood-forming organs and certain disorders involving the immune mechanism: Secondary | ICD-10-CM | POA: Insufficient documentation

## 2012-10-28 DIAGNOSIS — M545 Low back pain, unspecified: Secondary | ICD-10-CM | POA: Insufficient documentation

## 2012-10-28 MED ORDER — TRAMADOL HCL 50 MG PO TABS: 50.0000 mg | ORAL_TABLET | Freq: Four times a day (QID) | ORAL | Status: DC | PRN

## 2012-10-28 MED ORDER — CYCLOBENZAPRINE HCL 5 MG PO TABS: 5.0000 mg | ORAL_TABLET | Freq: Two times a day (BID) | ORAL | Status: DC | PRN

## 2012-10-28 NOTE — ED Notes (Signed)
Middle and upper back pain, neck pain x 3 weeks.  Denies recent injury.  Has applied heat, taken IBU and Tylenol without relief. Denies fever.

## 2012-10-28 NOTE — ED Provider Notes (Signed)
Medical screening examination/treatment/procedure(s) were performed by non-physician practitioner and as supervising physician I was immediately available for consultation/collaboration.   Rolan Bucco, MD 10/28/12 2017

## 2012-10-28 NOTE — ED Provider Notes (Signed)
History     CSN: [id number]  Arrival date & time 10/28/12  1800   First MD Initiated Contact with Patient 10/28/12 1811      Chief Complaint  Patient presents with  . 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 female presenting with back pain. The history is provided by the patient. No language interpreter was used.  Back Pain Location:  Lumbar spine Quality:  Aching and burning Radiates to:  L shoulder Pain severity:  Moderate Pain is:  Worse during the day Onset quality:  Unable to specify Duration:  3 weeks Timing:  Constant Progression:  Worsening Chronicity:  New Relieved by:  Nothing Worsened by:  Movement Ineffective treatments:  Cold packs, ibuprofen, heating pad and NSAIDs Associated symptoms: no bladder incontinence, no chest pain, no fever, no leg pain, no numbness, no tingling and no weakness     Past Medical History  Diagnosis Date  . Pregnant state, incidental   . Anemia     Past Surgical History  Procedure Laterality Date  . Tubal ligation    . Cholecystectomy      No family history on file.  History  Substance Use Topics  . Smoking status: Current Some Day Smoker  . Smokeless tobacco: Not on file  . Alcohol Use: No    OB History   Grav Para Term Preterm Abortions TAB SAB Ect Mult Living   3 1 1  0 1 0 1 0 0 1      Review of Systems  Constitutional: Negative for fever.  Respiratory: Negative.   Cardiovascular: Negative for chest pain.  Genitourinary: Negative for bladder incontinence.  Musculoskeletal: Positive for back pain.  Neurological: Negative for tingling, weakness and numbness.    Allergies  Review of patient's allergies indicates no known allergies.  Home Medications   Current Outpatient Rx  Name  Route  Sig  Dispense  Refill  . acetaminophen (TYLENOL) 500 MG  tablet   Oral   Take 500 mg by mouth every 6 (six) hours as needed. Back pain         . Prenatal Vit-Fe Fumarate-FA (PRENATAL MULTIVITAMIN) TABS   Oral   Take 1 tablet by mouth daily.           BP 127/98  Pulse 63  Temp(Src) 98.1 F (36.7 C) (Oral)  Resp 16  Ht 5\' 1"  (1.549 m)  Wt 190 lb (86.183 kg)  BMI 35.92 kg/m2  SpO2 100%  LMP 10/04/2011  Breastfeeding? Unknown  Physical Exam  Nursing note and vitals reviewed. Constitutional: She is oriented to person, place, and time. She appears well-developed and well-nourished.  HENT:  Head: Normocephalic and atraumatic.  Eyes: Conjunctivae and EOM are normal. Pupils are equal, round, and reactive to light.  Neck: Normal range of motion. Neck supple.  Cardiovascular: Normal rate and regular rhythm.   Pulmonary/Chest: Effort normal and breath sounds normal.  Musculoskeletal:       Arms: Tenderness with palpation  Neurological: She is alert and oriented to person, place, and time. Coordination normal.  Skin: Skin is warm and dry.  Psychiatric: She has a normal mood and affect.    ED Course  Procedures (including critical care time)  Labs Reviewed - No data to display No results found.   1. Back pain       MDM  dont think pt needs imaging:likely muscle  strain:pt is okay to follow up with with Dr. Hudnall:pt given flexeril and ultram:pt not having any neuro deficits        Teressa Lower, NP 10/28/12 1837

## 2014-06-25 ENCOUNTER — Encounter (HOSPITAL_BASED_OUTPATIENT_CLINIC_OR_DEPARTMENT_OTHER): Payer: Self-pay | Admitting: Emergency Medicine

## 2016-04-26 ENCOUNTER — Encounter (HOSPITAL_BASED_OUTPATIENT_CLINIC_OR_DEPARTMENT_OTHER): Payer: Self-pay | Admitting: Emergency Medicine

## 2016-04-26 ENCOUNTER — Emergency Department (HOSPITAL_BASED_OUTPATIENT_CLINIC_OR_DEPARTMENT_OTHER)
Admission: EM | Admit: 2016-04-26 | Discharge: 2016-04-26 | Disposition: A | Discharge status: Home / Self Care | Payer: Medicaid Other | Attending: Emergency Medicine | Admitting: Emergency Medicine

## 2016-04-26 ENCOUNTER — Emergency Department (HOSPITAL_BASED_OUTPATIENT_CLINIC_OR_DEPARTMENT_OTHER): Payer: Medicaid Other

## 2016-04-26 DIAGNOSIS — F17200 Nicotine dependence, unspecified, uncomplicated: Secondary | ICD-10-CM | POA: Insufficient documentation

## 2016-04-26 DIAGNOSIS — R42 Dizziness and giddiness: Secondary | ICD-10-CM | POA: Insufficient documentation

## 2016-04-26 DIAGNOSIS — R0602 Shortness of breath: Secondary | ICD-10-CM | POA: Insufficient documentation

## 2016-04-26 DIAGNOSIS — M7989 Other specified soft tissue disorders: Secondary | ICD-10-CM

## 2016-04-26 LAB — URINALYSIS, ROUTINE W REFLEX MICROSCOPIC
BILIRUBIN URINE: NEGATIVE
Glucose, UA: NEGATIVE mg/dL
HGB URINE DIPSTICK: NEGATIVE
KETONES UR: NEGATIVE mg/dL
Leukocytes, UA: NEGATIVE
Nitrite: NEGATIVE
PROTEIN: NEGATIVE mg/dL
Specific Gravity, Urine: 1.021 (ref 1.005–1.030)
pH: 5.5 (ref 5.0–8.0)

## 2016-04-26 LAB — CBC WITH DIFFERENTIAL/PLATELET
Basophils Absolute: 0 10*3/uL (ref 0.0–0.1)
Basophils Relative: 0 %
Eosinophils Absolute: 0.2 10*3/uL (ref 0.0–0.7)
Eosinophils Relative: 3 %
HCT: 41.8 % (ref 36.0–46.0)
Hemoglobin: 13.9 g/dL (ref 12.0–15.0)
Lymphocytes Relative: 20 %
Lymphs Abs: 1.4 10*3/uL (ref 0.7–4.0)
MCH: 30.2 pg (ref 26.0–34.0)
MCHC: 33.3 g/dL (ref 30.0–36.0)
MCV: 90.9 fL (ref 78.0–100.0)
Monocytes Absolute: 0.5 10*3/uL (ref 0.1–1.0)
Monocytes Relative: 7 %
Neutro Abs: 4.9 10*3/uL (ref 1.7–7.7)
Neutrophils Relative %: 70 %
Platelets: 184 10*3/uL (ref 150–400)
RBC: 4.6 MIL/uL (ref 3.87–5.11)
RDW: 13.2 % (ref 11.5–15.5)
WBC: 7 10*3/uL (ref 4.0–10.5)

## 2016-04-26 LAB — BASIC METABOLIC PANEL
ANION GAP: 7 (ref 5–15)
BUN: 7 mg/dL (ref 6–20)
CALCIUM: 8.5 mg/dL — AB (ref 8.9–10.3)
CO2: 25 mmol/L (ref 22–32)
CREATININE: 0.76 mg/dL (ref 0.44–1.00)
Chloride: 109 mmol/L (ref 101–111)
GLUCOSE: 83 mg/dL (ref 65–99)
Potassium: 3.3 mmol/L — ABNORMAL LOW (ref 3.5–5.1)
Sodium: 141 mmol/L (ref 135–145)

## 2016-04-26 LAB — PREGNANCY, URINE: Preg Test, Ur: NEGATIVE

## 2016-04-26 LAB — CBG MONITORING, ED: Glucose-Capillary: 117 mg/dL — ABNORMAL HIGH (ref 65–99)

## 2016-04-26 MED ORDER — POTASSIUM CHLORIDE CRYS ER 20 MEQ PO TBCR
40.0000 meq | EXTENDED_RELEASE_TABLET | Freq: Once | ORAL | Status: AC
  Administered 2016-04-26: 40 meq via ORAL
  Filled 2016-04-26: qty 2

## 2016-04-26 MED ORDER — SODIUM CHLORIDE 0.9 % IV BOLUS (SEPSIS)
1000.0000 mL | Freq: Once | INTRAVENOUS | Status: AC
  Administered 2016-04-26: 1000 mL via INTRAVENOUS

## 2016-04-26 NOTE — ED Triage Notes (Addendum)
Patient states that she is having swelling to her bilateral extremities. THe patient reports that she noticed it starting 2 weeks ago, but reports that today is the worst that she has ever had. The patient reports that she is dizzy and lightheaded  - reports that she feels like she is going to pass out. Patient states that she has had excessive urination recently and drinking a lot of fluids.

## 2016-04-26 NOTE — Discharge Instructions (Signed)
Your labs, EKG, and CXR today are unremarkable.  Your symptoms are likely due to your recent plasma donation.  Please hold off on plasma donations until your symptoms improve.  Drink plenty of fluids.  You may try compression stockings for your feet swelling.  [facility]

## 2016-04-26 NOTE — ED Notes (Signed)
PA-C at bedside 
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
## 2016-04-26 NOTE — ED Notes (Signed)
Pt states she feels light headed when she stands and walks. Able to bear weight on legs and states no pain.

## 2016-09-10 ENCOUNTER — Encounter (HOSPITAL_BASED_OUTPATIENT_CLINIC_OR_DEPARTMENT_OTHER): Payer: Self-pay

## 2016-09-10 ENCOUNTER — Emergency Department (HOSPITAL_BASED_OUTPATIENT_CLINIC_OR_DEPARTMENT_OTHER)
Admission: EM | Admit: 2016-09-10 | Discharge: 2016-09-10 | Disposition: A | Discharge status: Home / Self Care | Payer: Medicaid Other | Attending: Emergency Medicine | Admitting: Emergency Medicine

## 2016-09-10 DIAGNOSIS — F17210 Nicotine dependence, cigarettes, uncomplicated: Secondary | ICD-10-CM | POA: Insufficient documentation

## 2016-09-10 DIAGNOSIS — A599 Trichomoniasis, unspecified: Secondary | ICD-10-CM

## 2016-09-10 DIAGNOSIS — A5901 Trichomonal vulvovaginitis: Secondary | ICD-10-CM | POA: Insufficient documentation

## 2016-09-10 LAB — URINALYSIS, ROUTINE W REFLEX MICROSCOPIC
BILIRUBIN URINE: NEGATIVE
Glucose, UA: NEGATIVE mg/dL
Ketones, ur: NEGATIVE mg/dL
NITRITE: NEGATIVE
PROTEIN: NEGATIVE mg/dL
Specific Gravity, Urine: 1.015 (ref 1.005–1.030)
pH: 6 (ref 5.0–8.0)

## 2016-09-10 LAB — PREGNANCY, URINE: PREG TEST UR: NEGATIVE

## 2016-09-10 LAB — WET PREP, GENITAL
Sperm: NONE SEEN
YEAST WET PREP: NONE SEEN

## 2016-09-10 LAB — URINALYSIS, MICROSCOPIC (REFLEX)

## 2016-09-10 MED ORDER — METRONIDAZOLE 500 MG PO TABS: 500.0000 mg | ORAL_TABLET | Freq: Two times a day (BID) | ORAL | 0 refills | Status: DC

## 2016-09-10 MED ORDER — AZITHROMYCIN 250 MG PO TABS
1000.0000 mg | ORAL_TABLET | Freq: Once | ORAL | Status: AC
  Administered 2016-09-10: 1000 mg via ORAL
  Filled 2016-09-10: qty 4

## 2016-09-10 MED ORDER — CEFTRIAXONE SODIUM 250 MG IJ SOLR
250.0000 mg | Freq: Once | INTRAMUSCULAR | Status: AC
  Administered 2016-09-10: 250 mg via INTRAMUSCULAR
  Filled 2016-09-10: qty 250

## 2016-09-10 MED FILL — metroNIDAZOLE 500 MG TABS: 500 | 7 days supply | Qty: 14 | Fill #0

## 2016-09-10 NOTE — Discharge Instructions (Signed)
Flagyl as prescribed.  We will call you if your cultures indicate you require further action or treatment.  You should inform all sexual partners so that they too can be treated.  Abstain from unprotected sexual activity for the next 2 weeks.

## 2016-09-10 NOTE — ED Triage Notes (Addendum)
C/o pelvic/lower abd pain x 4 days +vaginal d/c and cloudy urine-NAD-steady gait

## 2016-09-10 NOTE — ED Notes (Signed)
ED Provider at bedside. 

## 2016-09-10 NOTE — ED Provider Notes (Signed)
[facility] Provider [facility]   CSN: [id number] Arrival date & time: 09/10/16  1216     History   Chief Complaint Chief Complaint  Patient presents with  . Pelvic Pain    HPI Molly Mcknight is a 31 y.o. female.  Patient is a 31 year old female with history of tubal ligation. She presents for evaluation of vaginal irritation and discharge since finishing her period one week ago. She denies any fevers or chills. She denies any dysuria, but does report cloudy urine. She denies the possibility of pregnancy.   The history is provided by the patient.  Pelvic Pain  This is a new problem. Episode onset: 3 days ago. The problem occurs constantly. The problem has been gradually worsening. Associated symptoms include abdominal pain. Pertinent negatives include no chest pain. Nothing aggravates the symptoms. Nothing relieves the symptoms. She has tried nothing for the symptoms.    Past Medical History:  Diagnosis Date  . Anemia     There are no active problems to display for this patient.   Past Surgical History:  Procedure Laterality Date  . CHOLECYSTECTOMY    . TUBAL LIGATION      OB History    Gravida Para Term Preterm AB Living   3 1 1  0 1 1   SAB TAB Ectopic Multiple Live Births   1 0 0 0 1       Home Medications    Prior to Admission medications   Not on File    Family History No family history on file.  Social History Social History  Substance Use Topics  . Smoking status: Current Some Day Smoker    Types: Cigarettes  . Smokeless tobacco: Never Used  . Alcohol use No     Allergies   Patient has no known allergies.   Review of Systems Review of Systems  Cardiovascular: Negative for chest pain.  Gastrointestinal: Positive for abdominal pain.  Genitourinary: Positive for pelvic pain.  All other systems reviewed and are negative.    Physical Exam Updated Vital Signs BP 112/68 (BP Location: Left Arm)   Pulse 69   Temp 98.4 F (36.9  C) (Oral)   Resp 18   Ht 5\' 1"  (1.549 m)   Wt 193 lb (87.5 kg)   LMP 09/01/2016   SpO2 100%   BMI 36.47 kg/m   Physical Exam  Constitutional: She is oriented to person, place, and time. She appears well-developed and well-nourished. No distress.  HENT:  Head: Normocephalic and atraumatic.  Neck: Normal range of motion. Neck supple.  Cardiovascular: Normal rate and regular rhythm.  Exam reveals no gallop and no friction rub.   No murmur heard. Pulmonary/Chest: Effort normal and breath sounds normal. No respiratory distress. She has no wheezes.  Abdominal: Soft. Bowel sounds are normal. She exhibits no distension. There is tenderness. There is no rebound and no guarding.  There is mild tenderness in the suprapubic region.  Genitourinary: Vaginal discharge found.  Genitourinary Comments: There is a whitish yellow discharge present. There is no cervical motion tenderness, adnexal tenderness or masses, or lesions of the external genitalia.  Musculoskeletal: Normal range of motion.  Neurological: She is alert and oriented to person, place, and time.  Skin: Skin is warm and dry. She is not diaphoretic.  Nursing note and vitals reviewed.    ED Treatments / Results  Labs (all labs ordered are listed, but only abnormal results are displayed) Labs Reviewed  WET PREP, GENITAL  PREGNANCY, URINE  URINALYSIS, ROUTINE W REFLEX MICROSCOPIC  GC/CHLAMYDIA PROBE AMP ([place]) NOT AT [facility]    EKG  EKG Interpretation None       Radiology No results found.  Procedures Procedures (including critical care time)  Medications Ordered in ED Medications - No data to display   Initial Impression / Assessment and Plan / ED Course  I have reviewed the triage vital signs and the nursing notes.  Pertinent labs & imaging results that were available during my care of the patient were reviewed by me and considered in my medical decision making (see chart for details).     Patient  presents with complaints of vaginal irritation and discharge. Pelvic examination reveals a slight yellowish discharge but no adnexal tenderness or cervical motion tenderness. Wet prep shows many white cells and is positive for trichomonas. She'll be treated with Rocephin, Zithromax, and by mouth Flagyl. To return as needed for any problems.  She is advised to abstain from unprotected sexual activity for the next 2 weeks and to have her partners informed and treated.  Final Clinical Impressions(s) / ED Diagnoses   Final diagnoses:  None    New Prescriptions New Prescriptions   No medications on file     Geoffery Lyonsouglas Kathryn Linarez, MD 09/10/16 1524

## 2016-09-11 LAB — GC/CHLAMYDIA PROBE AMP (~~LOC~~) NOT AT ARMC
CHLAMYDIA, DNA PROBE: NEGATIVE
Neisseria Gonorrhea: NEGATIVE

## 2016-11-09 ENCOUNTER — Emergency Department (HOSPITAL_BASED_OUTPATIENT_CLINIC_OR_DEPARTMENT_OTHER)
Admission: EM | Admit: 2016-11-09 | Discharge: 2016-11-09 | Disposition: A | Discharge status: Home / Self Care | Payer: Medicaid Other | Attending: Dermatology | Admitting: Dermatology

## 2016-11-09 ENCOUNTER — Encounter (HOSPITAL_BASED_OUTPATIENT_CLINIC_OR_DEPARTMENT_OTHER): Payer: Self-pay

## 2016-11-09 DIAGNOSIS — M79641 Pain in right hand: Secondary | ICD-10-CM | POA: Insufficient documentation

## 2016-11-09 DIAGNOSIS — F17210 Nicotine dependence, cigarettes, uncomplicated: Secondary | ICD-10-CM | POA: Insufficient documentation

## 2016-11-09 DIAGNOSIS — Z79899 Other long term (current) drug therapy: Secondary | ICD-10-CM | POA: Insufficient documentation

## 2016-11-09 DIAGNOSIS — Z5321 Procedure and treatment not carried out due to patient leaving prior to being seen by health care provider: Secondary | ICD-10-CM | POA: Insufficient documentation

## 2016-11-09 NOTE — ED Notes (Signed)
Pt called to treatment room from lobby with no answer.

## 2016-11-09 NOTE — ED Notes (Signed)
Pt called to treatment room from lobby with no answer for 2nd time.

## 2016-11-09 NOTE — ED Triage Notes (Addendum)
Pt reports right ring finger tip amputated on Thursday at [facility]. Reports ongoing pain, and wants area evaluated. Taking Keflex, denies fever, no vomiting. States Tylenol ineffective.

## 2016-11-12 ENCOUNTER — Encounter (HOSPITAL_BASED_OUTPATIENT_CLINIC_OR_DEPARTMENT_OTHER): Payer: Self-pay | Admitting: *Deleted

## 2016-11-12 ENCOUNTER — Emergency Department (HOSPITAL_BASED_OUTPATIENT_CLINIC_OR_DEPARTMENT_OTHER)
Admission: EM | Admit: 2016-11-12 | Discharge: 2016-11-12 | Disposition: A | Discharge status: Home / Self Care | Payer: Medicaid Other | Attending: Emergency Medicine | Admitting: Emergency Medicine

## 2016-11-12 DIAGNOSIS — S68119D Complete traumatic metacarpophalangeal amputation of unspecified finger, subsequent encounter: Secondary | ICD-10-CM

## 2016-11-12 DIAGNOSIS — W319XXD Contact with unspecified machinery, subsequent encounter: Secondary | ICD-10-CM | POA: Insufficient documentation

## 2016-11-12 DIAGNOSIS — S68124D Partial traumatic metacarpophalangeal amputation of right ring finger, subsequent encounter: Secondary | ICD-10-CM | POA: Insufficient documentation

## 2016-11-12 DIAGNOSIS — F17210 Nicotine dependence, cigarettes, uncomplicated: Secondary | ICD-10-CM | POA: Insufficient documentation

## 2016-11-12 MED ORDER — NAPROXEN 500 MG PO TABS: 500.0000 mg | ORAL_TABLET | Freq: Two times a day (BID) | ORAL | 0 refills | Status: DC

## 2016-11-12 MED ORDER — KETOROLAC TROMETHAMINE 60 MG/2ML IM SOLN
60.0000 mg | Freq: Once | INTRAMUSCULAR | Status: AC
  Administered 2016-11-12: 60 mg via INTRAMUSCULAR
  Filled 2016-11-12: qty 2

## 2016-11-12 MED FILL — NAPROXEN 500 MG TABLET: 500 | 10 days supply | Qty: 20 | Fill #0

## 2016-11-12 NOTE — ED Provider Notes (Signed)
[facility]   CSN: [id number] Arrival date & time: 11/12/16  0844     History   Chief Complaint Chief Complaint  Patient presents with  . Hand Pain    HPI Molly Mcknight is a 31 y.o. female.  Patient is a healthy 31 year old female with no past medical history presenting today with ongoing finger pain. Approximately one week ago patient had her hand stuck in a machine at work and amputated the distal one third of her right fourth digit. She was seen initially at [facility] where her finger was evaluated and she had an amputation revision. They did not attempt to reattach her finger. Since that time she has had persistent swelling and pain. She has bumped it a few times which has made it much more painful. Today she just wanted it rechecked. She is currently still taking Keflex. She denies any drainage or redness to the wound. But still remains swollen. She is trying to elevate it but states she has this sharp searing pain that runs from her finger all the way up her arm. Aching Tylenol for this but the pain is 9 out of 10 and burning and sharp in nature.   The history is provided by the patient.  Hand Pain     Past Medical History:  Diagnosis Date  . Anemia     There are no active problems to display for this patient.   Past Surgical History:  Procedure Laterality Date  . CHOLECYSTECTOMY    . TUBAL LIGATION      OB History    Gravida Para Term Preterm AB Living   3 1 1  0 1 1   SAB TAB Ectopic Multiple Live Births   1 0 0 0 1       Home Medications    Prior to Admission medications   Medication Sig Start Date End Date Taking? Authorizing Provider  Acetaminophen (TYLENOL 8 HOUR PO) Take by mouth.    Historical Provider, MD  Cephalexin (KEFLEX PO) Take by mouth.    Historical Provider, MD  metroNIDAZOLE (FLAGYL) 500 MG tablet Take 1 tablet (500 mg total) by mouth 2 (two) times daily. One po bid x 7 days 09/10/16   Geoffery Lyons, MD    naproxen (NAPROSYN) 500 MG tablet Take 1 tablet (500 mg total) by mouth 2 (two) times daily. 11/12/16   Gwyneth Sprout, MD    Family History History reviewed. No pertinent family history.  Social History Social History  Substance Use Topics  . Smoking status: Current Some Day Smoker    Types: Cigarettes  . Smokeless tobacco: Never Used  . Alcohol use No     Allergies   Patient has no known allergies.   Review of Systems Review of Systems  All other systems reviewed and are negative.    Physical Exam Updated Vital Signs BP 115/71 (BP Location: Left Arm)   Pulse 61   Temp 98.2 F (36.8 C) (Oral)   Resp 18   Ht 5\' 1"  (1.549 m)   Wt 200 lb (90.7 kg)   LMP 10/26/2016   SpO2 100%   BMI 37.79 kg/m   Physical Exam  Constitutional: She is oriented to person, place, and time. She appears well-developed and well-nourished.  Tearful on exam  HENT:  Head: Normocephalic and atraumatic.  Cardiovascular: Normal rate.   Pulmonary/Chest: Effort normal.  Musculoskeletal:       Hands: Neurological: She is alert and oriented to person, place,  and time.  Skin: Skin is warm and dry.  Nursing note and vitals reviewed.    ED Treatments / Results  Labs (all labs ordered are listed, but only abnormal results are displayed) Labs Reviewed - No data to display  EKG  EKG Interpretation None       Radiology No results found.  Procedures Procedures (including critical care time)  Medications Ordered in ED Medications  ketorolac (TORADOL) injection 60 mg (not administered)     Initial Impression / Assessment and Plan / ED Course  I have reviewed the triage vital signs and the nursing notes.  Pertinent labs & imaging results that were available during my care of the patient were reviewed by me and considered in my medical decision making (see chart for details).     Patient presenting for recheck after a finger amputation. It appears to be healing well. Feel her  pain is typical of this injury 1 week out. No signs of infection or other concerning features. Recommended naproxen, elevation and ice. Has follow-up with plastic surgery next week.  Final Clinical Impressions(s) / ED Diagnoses   Final diagnoses:  Finger amputation, no complication, subsequent encounter    New Prescriptions New Prescriptions   NAPROXEN (NAPROSYN) 500 MG TABLET    Take 1 tablet (500 mg total) by mouth 2 (two) times daily.     Gwyneth SproutWhitney Aubrey Voong, MD 11/12/16 [phone]

## 2016-11-12 NOTE — ED Triage Notes (Signed)
Pt reports ongoing pain to her right 4th digit, radiating up her arm, recent amputation of fingertip from a machine at work. dsd removed to reveal sutures healing well, dry and intact, no drainage or bleeding noted. Dr. Anitra Lauthplunkett at bedside to assess pt.

## 2017-06-23 ENCOUNTER — Encounter (HOSPITAL_BASED_OUTPATIENT_CLINIC_OR_DEPARTMENT_OTHER): Payer: Self-pay | Admitting: Emergency Medicine

## 2017-06-23 ENCOUNTER — Emergency Department (HOSPITAL_BASED_OUTPATIENT_CLINIC_OR_DEPARTMENT_OTHER)
Admission: EM | Admit: 2017-06-23 | Discharge: 2017-06-23 | Disposition: A | Discharge status: Home / Self Care | Payer: BLUE CROSS/BLUE SHIELD | Attending: Emergency Medicine | Admitting: Emergency Medicine

## 2017-06-23 DIAGNOSIS — N760 Acute vaginitis: Secondary | ICD-10-CM | POA: Insufficient documentation

## 2017-06-23 DIAGNOSIS — R1030 Lower abdominal pain, unspecified: Secondary | ICD-10-CM

## 2017-06-23 DIAGNOSIS — H1032 Unspecified acute conjunctivitis, left eye: Secondary | ICD-10-CM | POA: Diagnosis not present

## 2017-06-23 DIAGNOSIS — F17210 Nicotine dependence, cigarettes, uncomplicated: Secondary | ICD-10-CM | POA: Insufficient documentation

## 2017-06-23 DIAGNOSIS — H109 Unspecified conjunctivitis: Secondary | ICD-10-CM

## 2017-06-23 DIAGNOSIS — B9689 Other specified bacterial agents as the cause of diseases classified elsewhere: Secondary | ICD-10-CM

## 2017-06-23 LAB — WET PREP, GENITAL
SPERM: NONE SEEN
Trich, Wet Prep: NONE SEEN
YEAST WET PREP: NONE SEEN

## 2017-06-23 LAB — URINALYSIS, ROUTINE W REFLEX MICROSCOPIC
BILIRUBIN URINE: NEGATIVE
Glucose, UA: NEGATIVE mg/dL
HGB URINE DIPSTICK: NEGATIVE
Ketones, ur: NEGATIVE mg/dL
Nitrite: NEGATIVE
PH: 6 (ref 5.0–8.0)
PROTEIN: NEGATIVE mg/dL

## 2017-06-23 LAB — URINALYSIS, MICROSCOPIC (REFLEX)

## 2017-06-23 LAB — PREGNANCY, URINE: Preg Test, Ur: NEGATIVE

## 2017-06-23 MED ORDER — METRONIDAZOLE 500 MG PO TABS: 500.0000 mg | ORAL_TABLET | Freq: Two times a day (BID) | ORAL | 0 refills | Status: DC

## 2017-06-23 MED ORDER — AZITHROMYCIN 250 MG PO TABS
1000.0000 mg | ORAL_TABLET | Freq: Once | ORAL | Status: AC
  Administered 2017-06-23: 1000 mg via ORAL
  Filled 2017-06-23: qty 4

## 2017-06-23 MED ORDER — CEFTRIAXONE SODIUM 250 MG IJ SOLR
250.0000 mg | Freq: Once | INTRAMUSCULAR | Status: AC
  Administered 2017-06-23: 250 mg via INTRAMUSCULAR
  Filled 2017-06-23: qty 250

## 2017-06-23 MED ORDER — OFLOXACIN 0.3 % OP SOLN: 1.0000 [drp] | Freq: Four times a day (QID) | OPHTHALMIC | 0 refills | Status: AC

## 2017-06-23 MED FILL — OFLOXACIN 0.3% EYE DROPS: 0.3 | 25 days supply | Qty: 5 | Fill #0

## 2017-06-23 MED FILL — metroNIDAZOLE 500 MG TABS: 500 | 7 days supply | Qty: 14 | Fill #0

## 2017-06-23 NOTE — ED Notes (Signed)
ED Provider at bedside. 

## 2017-06-23 NOTE — Discharge Instructions (Signed)
You were treated today for gonorrhea and chlamydia, your wet prep shows BV will treat this with flagyl. With lower abdominal pain is not improving with this treatment please follow-up with primary care provider using the resources provided, if abdominal pain worsens, you developed nausea, vomiting, fevers or other concerning symptoms please return to the ED for serum evaluation.  Eye drainage is likely due to conjunctivitis, (pink eye), you can treat this with ofloxacin eyedrops, 4 times a day for the next 7 days. If symptoms are not improving, you develop difficulty with vision or worsening pain please return sooner for follow-up.

## 2017-06-23 NOTE — ED Provider Notes (Signed)
[facility] EMERGENCY DEPARTMENT Provider Note   CSN: [id number] Arrival date & time: 06/23/17  [id number]     History   Chief Complaint Chief Complaint  Patient presents with  . Eye Drainage  . Vaginal Discharge    HPI  Molly BergeronJessica A Mcknight is a 31 y.o. Female With a history of a cholecystectomy and tubal ligation, presents with multiple complaints. Patient complaining of lower abdominal pain, which she describes as sharp and intermittent, with associated vaginal discharge after her period ended on Friday. Patient describes thick white discharge that been present since Friday. Pt also reports some discomfort with urination. Patient denies associated fevers, no nausea vomiting, no upper abdominal pain. She reports she frequently goes back and forth between diarrhea and constipation, but this has been going on for quiet a while.  Patient also complaining of left eye drainage and swelling since yesterday morning. Patient reports some whitish discharge from the eye and eye watering, with redness inside the eyelid. Patient reports very sensitive to light, but denies pain. Denies vision changes. Patient does not wear contacts. Denies any contacts to anyone with pinkeye recently.      Past Medical History:  Diagnosis Date  . Anemia     There are no active problems to display for this patient.   Past Surgical History:  Procedure Laterality Date  . AMPUTATION FINGER Right   . CHOLECYSTECTOMY    . TUBAL LIGATION      OB History    Gravida Para Term Preterm AB Living   3 1 1  0 1 1   SAB TAB Ectopic Multiple Live Births   1 0 0 0 1       Home Medications    Prior to Admission medications   Medication Sig Start Date End Date Taking? Authorizing Provider  Acetaminophen (TYLENOL 8 HOUR PO) Take by mouth.    [provider]  Cephalexin (KEFLEX PO) Take by mouth.    [provider]  metroNIDAZOLE (FLAGYL) 500 MG tablet Take 1 tablet (500 mg total) by mouth 2  (two) times daily. One po bid x 7 days 09/10/16   Geoffery Lyonselo, Douglas, MD  naproxen (NAPROSYN) 500 MG tablet Take 1 tablet (500 mg total) by mouth 2 (two) times daily. 11/12/16   Gwyneth SproutPlunkett, Whitney, MD    Family History History reviewed. No pertinent family history.  Social History Social History  Substance Use Topics  . Smoking status: Current Some Day Smoker    Packs/day: 0.50    Types: Cigarettes  . Smokeless tobacco: Never Used  . Alcohol use No     Allergies   Patient has no known allergies.   Review of Systems Review of Systems  Constitutional: Negative for chills and fever.  HENT: Negative for ear pain, rhinorrhea and sore throat.   Eyes: Positive for photophobia, discharge and redness. Negative for pain and visual disturbance.  Respiratory: Negative for cough, chest tightness and shortness of breath.   Cardiovascular: Negative for chest pain.  Gastrointestinal: Positive for abdominal pain. Negative for constipation, diarrhea, nausea and vomiting.  Genitourinary: Positive for dysuria, pelvic pain and vaginal discharge. Negative for flank pain and vaginal bleeding.  Musculoskeletal: Negative for myalgias.  Skin: Negative for rash.  Neurological: Negative for light-headedness and headaches.     Physical Exam Updated Vital Signs BP 107/72 (BP Location: Right Arm)   Pulse 73   Temp 98.6 F (37 C) (Oral)   Resp 18   Ht 5\' 1"  (1.549 m)  Wt 90.7 kg (200 lb)   LMP 06/18/2017 (Approximate)   SpO2 98%   BMI 37.79 kg/m   Physical Exam  Constitutional: She appears well-developed and well-nourished. No distress.  HENT:  Head: Normocephalic and atraumatic.  Mouth/Throat: Oropharynx is clear and moist.  Eyes: Pupils are equal, round, and reactive to light. EOM are normal. Right eye exhibits no discharge. Left eye exhibits discharge.  Visual acuity R 20/15, L 20/20, Bilat 20/15 No consensual pain, no pain with EOMs Conjunctiva of left eye erythematous with cobblestoning, and  white discharge present, sclera normal Right conjunctiva normal and without discharge  Neck: Normal range of motion. Neck supple.  Cardiovascular: Normal rate, regular rhythm and normal heart sounds.   Pulmonary/Chest: Effort normal and breath sounds normal. No respiratory distress.  Abdominal: Soft. Bowel sounds are normal. She exhibits no distension and no mass. There is tenderness. There is no rebound and no guarding.  Tenderness across lower abdomen, no focal area of tenderness, no guarding, neg murphy's sign, non-tender at McBurney's point, no peritoneal signs, no CVA tenderness  Genitourinary: There is no lesion on the right labia. There is no lesion on the left labia. Cervix exhibits discharge. Cervix exhibits no motion tenderness and no friability. Right adnexum displays no mass. Left adnexum displays no mass. No erythema or bleeding in the vagina. Vaginal discharge found.  Genitourinary Comments: White discharge coming from cervix, no cervical erythema to suggest cervicitis, discomfort with bimanual exam but no CMT or focal adnexal tenderness, no masses palpated  Musculoskeletal: She exhibits no edema or deformity.  Lymphadenopathy:    She has no cervical adenopathy.  Neurological: She is alert. Coordination normal.  Skin: Skin is warm and dry. Capillary refill takes less than 2 seconds. She is not diaphoretic.  Psychiatric: She has a normal mood and affect. Her behavior is normal.  Nursing note and vitals reviewed.    ED Treatments / Results  Labs (all labs ordered are listed, but only abnormal results are displayed) Labs Reviewed  WET PREP, GENITAL - Abnormal; Notable for the following:       Result Value   Clue Cells Wet Prep HPF POC PRESENT (*)    WBC, Wet Prep HPF POC MANY (*)    All other components within normal limits  URINALYSIS, ROUTINE W REFLEX MICROSCOPIC - Abnormal; Notable for the following:    Specific Gravity, Urine >1.030 (*)    Leukocytes, UA SMALL (*)    All  other components within normal limits  URINALYSIS, MICROSCOPIC (REFLEX) - Abnormal; Notable for the following:    Bacteria, UA MANY (*)    Squamous Epithelial / LPF TOO NUMEROUS TO COUNT (*)    All other components within normal limits  GC/CHLAMYDIA PROBE AMP ([place]) NOT AT [facility]  PREGNANCY, URINE    EKG  EKG Interpretation None       Radiology No results found.  Procedures Procedures (including critical care time)  Medications Ordered in ED Medications  cefTRIAXone (ROCEPHIN) injection 250 mg (250 mg Intramuscular Given 06/23/17 1224)  azithromycin (ZITHROMAX) tablet 1,000 mg (1,000 mg Oral Given 06/23/17 1224)     Initial Impression / Assessment and Plan / ED Course  I have reviewed the triage vital signs and the nursing notes.  Pertinent labs & imaging results that were available during my care of the patient were reviewed by me and considered in my medical decision making (see chart for details).  Molly Mcknight presents with symptoms consistent with bacterial conjunctivitis.  Purulent discharge on exam.  No entrapment, or consensual photophobia.  Presentation non-concerning for iritis, corneal abrasions, or HSV.  No evidence of preseptal or orbital cellulitis.  Pt is not a contact lens wearer.  Patient will be given ofloxacin drops.  Personal hygiene and frequent handwashing discussed.  Patient advised to followup with PCP/ophthalmologist for reevaluation in several days if not imrpoving.    Pt also presents with lower abdominal pain and vaginal discharge. Pt has been treated prophylactically with azithromycin and Rocephin due to pts history, pelvic exam, and wet prep with increased WBCs and clue cells.  Pt not concerning for PID because hemodynamically stable and no cervical motion tenderness on pelvic exam. Given abdominal exam, low suspicion for other intraabdominal pathology. Dicussed reasons to return for reevalution, especially if symptoms do not improve after  treatment. Pt has also been treated with Flagyl for Bacterial Vaginosis. Pt has been advised to not drink alcohol while on this medication.  Patient to be discharged with instructions to follow up with OBGYN/PCP. Pt understands that they have GC/Chlamydia cultures pending and that they will need to inform all sexual partners if results return positive. Discussed importance of using protection when sexually active.   Patient verbalizes understanding and is agreeable with discharge.  Patient discussed with Dr. Adela Lank, who saw patient as well and agrees with plan.  Final Clinical Impressions(s) / ED Diagnoses   Final diagnoses:  Lower abdominal pain  BV (bacterial vaginosis)  Conjunctivitis of left eye, unspecified conjunctivitis type    New Prescriptions Discharge Medication List as of 06/23/2017  1:04 PM    START taking these medications   Details  !! metroNIDAZOLE (FLAGYL) 500 MG tablet Take 1 tablet (500 mg total) by mouth 2 (two) times daily. One po bid x 7 days, Starting Wed 06/23/2017, Print    ofloxacin (OCUFLOX) 0.3 % ophthalmic solution Place 1 drop into the left eye 4 (four) times daily., Starting Wed 06/23/2017, Until Wed 06/30/2017, Print     !! - Potential duplicate medications found. Please discuss with provider.       Dartha Lodge, PA-C 06/24/17 1624    Melene Plan, DO 06/24/17 2020

## 2017-06-23 NOTE — ED Triage Notes (Signed)
Reports left eye drainage since yesterday morning.  Additionally reports vaginal discharge and lower abdominal pain after period ended last week.

## 2017-06-23 NOTE — ED Notes (Signed)
Lab notified of orders 

## 2017-06-24 LAB — GC/CHLAMYDIA PROBE AMP (~~LOC~~) NOT AT ARMC
CHLAMYDIA, DNA PROBE: POSITIVE — AB
NEISSERIA GONORRHEA: NEGATIVE

## 2017-06-27 ENCOUNTER — Encounter (HOSPITAL_BASED_OUTPATIENT_CLINIC_OR_DEPARTMENT_OTHER): Payer: Self-pay | Admitting: *Deleted

## 2017-06-27 ENCOUNTER — Other Ambulatory Visit: Payer: Self-pay

## 2017-06-27 ENCOUNTER — Emergency Department (HOSPITAL_BASED_OUTPATIENT_CLINIC_OR_DEPARTMENT_OTHER)
Admission: EM | Admit: 2017-06-27 | Discharge: 2017-06-27 | Disposition: A | Discharge status: Home / Self Care | Payer: BLUE CROSS/BLUE SHIELD | Attending: Emergency Medicine | Admitting: Emergency Medicine

## 2017-06-27 DIAGNOSIS — Z79899 Other long term (current) drug therapy: Secondary | ICD-10-CM | POA: Diagnosis not present

## 2017-06-27 DIAGNOSIS — A740 Chlamydial conjunctivitis: Secondary | ICD-10-CM | POA: Insufficient documentation

## 2017-06-27 DIAGNOSIS — H10022 Other mucopurulent conjunctivitis, left eye: Secondary | ICD-10-CM | POA: Diagnosis present

## 2017-06-27 DIAGNOSIS — F17210 Nicotine dependence, cigarettes, uncomplicated: Secondary | ICD-10-CM | POA: Diagnosis not present

## 2017-06-27 MED ORDER — DOXYCYCLINE HYCLATE 100 MG PO CAPS: 100.0000 mg | ORAL_CAPSULE | Freq: Two times a day (BID) | ORAL | 0 refills | Status: DC

## 2017-06-27 NOTE — Discharge Instructions (Signed)
Your eye infection is likely due to chlamydial conjunctivitis.  Take antibiotic as prescribed for the full duration.  If you notice no improvement in 2-3 days please follow up with eye specialist for further care.  Notify your partner to get tested for sexually transmitted infection.

## 2017-06-27 NOTE — ED Provider Notes (Signed)
[facility] EMERGENCY DEPARTMENT Provider Note   CSN: [id number] Arrival date & time: 06/27/17  1218     History   Chief Complaint Chief Complaint  Patient presents with  . Conjunctivitis    HPI Molly Mcknight is a 31 y.o. female.  HPI   Patient report left eye drainage and irritation ongoing for nearly a week.  She also reported having vaginal discharge during that same duration.  She was seen in the ER on 10/31 for her symptoms.  Patient was giving azithromycin and Rocephin prophylactically.  She was also treated for BV with Flagyl.  She was prescribed home with antibiotic eyedrops.  She reports using eyedrops and taking medication as prescribed but noticed no improvement.  Continue to endorse irritation primarily to her left eye with copious amount of discharge.  No report of headache, double vision, vision loss or itchiness.  Denies any new sexual partner.  Past Medical History:  Diagnosis Date  . Anemia     There are no active problems to display for this patient.   Past Surgical History:  Procedure Laterality Date  . AMPUTATION FINGER Right   . CHOLECYSTECTOMY    . TUBAL LIGATION      OB History    Gravida Para Term Preterm AB Living   3 1 1  0 1 1   SAB TAB Ectopic Multiple Live Births   1 0 0 0 1       Home Medications    Prior to Admission medications   Medication Sig Start Date End Date Taking? Authorizing Provider  Acetaminophen (TYLENOL 8 HOUR PO) Take by mouth.    [provider]  Cephalexin (KEFLEX PO) Take by mouth.    [provider]  metroNIDAZOLE (FLAGYL) 500 MG tablet Take 1 tablet (500 mg total) by mouth 2 (two) times daily. One po bid x 7 days 09/10/16   Geoffery Lyons, MD  metroNIDAZOLE (FLAGYL) 500 MG tablet Take 1 tablet (500 mg total) by mouth 2 (two) times daily. One po bid x 7 days 06/23/17   Dartha Lodge, PA-C  naproxen (NAPROSYN) 500 MG tablet Take 1 tablet (500 mg total) by mouth 2 (two) times daily.  11/12/16   Gwyneth Sprout, MD  ofloxacin (OCUFLOX) 0.3 % ophthalmic solution Place 1 drop into the left eye 4 (four) times daily. 06/23/17 06/30/17  Dartha Lodge, PA-C    Family History History reviewed. No pertinent family history.  Social History Social History   Tobacco Use  . Smoking status: Current Some Day Smoker    Packs/day: 0.50    Types: Cigarettes  . Smokeless tobacco: Never Used  Substance Use Topics  . Alcohol use: No  . Drug use: No     Allergies   Patient has no known allergies.   Review of Systems Review of Systems  Constitutional: Negative for fever.  Eyes: Positive for pain, discharge and redness. Negative for photophobia and visual disturbance.  Neurological: Negative for headaches.     Physical Exam Updated Vital Signs BP 119/76 (BP Location: Left Arm)   Pulse 76   Temp 98.2 F (36.8 C)   Resp 18   Ht 5\' 1"  (1.549 m)   Wt 90.7 kg (200 lb)   LMP 06/18/2017 (Approximate)   SpO2 100%   BMI 37.79 kg/m   Physical Exam  Constitutional: She appears well-developed and well-nourished. No distress.  HENT:  Head: Atraumatic.  Eyes: EOM and lids are normal. Pupils are equal, round, and  reactive to light. Lids are everted and swept, no foreign bodies found. Left eye exhibits discharge and exudate. Left eye exhibits no chemosis and no hordeolum. No foreign body present in the left eye. Left conjunctiva is injected. Left conjunctiva has no hemorrhage.  Neck: Neck supple.  Neurological: She is alert.  Skin: No rash noted.  Psychiatric: She has a normal mood and affect.  Nursing note and vitals reviewed.    ED Treatments / Results  Labs (all labs ordered are listed, but only abnormal results are displayed) Labs Reviewed - No data to display  EKG  EKG Interpretation None       Radiology No results found.  Procedures Procedures (including critical care time)  Medications Ordered in ED Medications - No data to display   Initial  Impression / Assessment and Plan / ED Course  I have reviewed the triage vital signs and the nursing notes.  Pertinent labs & imaging results that were available during my care of the patient were reviewed by me and considered in my medical decision making (see chart for details).     BP 119/76 (BP Location: Left Arm)   Pulse 76   Temp 98.2 F (36.8 C)   Resp 18   Ht 5\' 1"  (1.549 m)   Wt 90.7 kg (200 lb)   LMP 06/18/2017 (Approximate)   SpO2 100%   BMI 37.79 kg/m    Final Clinical Impressions(s) / ED Diagnoses   Final diagnoses:  Chlamydiae conjunctivitis    New Prescriptions This SmartLink is deprecated. Use AVSMEDLIST instead to display the medication list for a patient.   Pt here with persistent L eye redness and discharge.  She was tested positive for chlamydial infection several days prior when she present with similar sxs.  Her sxs not improved with ofloxacin eye drop.  Suspect chlamydia conjunctivitis.  Will prescribe doxycycline PO x 7 days as treatment.  Recommend pt to notify partner to get tested as well. Ophthalmology referral given.     Fayrene Helperran, Verlon Carcione, PA-C 06/27/17 1509    Vanetta MuldersZackowski, Scott, MD 06/30/17 [phone]

## 2017-06-27 NOTE — ED Notes (Signed)
Pt discharged to home NAD.  

## 2017-06-27 NOTE — ED Triage Notes (Signed)
Left eye conjunctivitis x several days. Seen in the ED, given eye drops without improvement.

## 2017-08-10 ENCOUNTER — Emergency Department (HOSPITAL_BASED_OUTPATIENT_CLINIC_OR_DEPARTMENT_OTHER)
Admission: EM | Admit: 2017-08-10 | Discharge: 2017-08-11 | Disposition: A | Discharge status: Home / Self Care | Payer: Self-pay | Attending: Emergency Medicine | Admitting: Emergency Medicine

## 2017-08-10 ENCOUNTER — Encounter (HOSPITAL_BASED_OUTPATIENT_CLINIC_OR_DEPARTMENT_OTHER): Payer: Self-pay | Admitting: *Deleted

## 2017-08-10 ENCOUNTER — Other Ambulatory Visit: Payer: Self-pay

## 2017-08-10 DIAGNOSIS — N898 Other specified noninflammatory disorders of vagina: Secondary | ICD-10-CM

## 2017-08-10 DIAGNOSIS — D649 Anemia, unspecified: Secondary | ICD-10-CM | POA: Insufficient documentation

## 2017-08-10 DIAGNOSIS — B9789 Other viral agents as the cause of diseases classified elsewhere: Secondary | ICD-10-CM | POA: Insufficient documentation

## 2017-08-10 DIAGNOSIS — B9689 Other specified bacterial agents as the cause of diseases classified elsewhere: Secondary | ICD-10-CM

## 2017-08-10 DIAGNOSIS — R350 Frequency of micturition: Secondary | ICD-10-CM | POA: Insufficient documentation

## 2017-08-10 DIAGNOSIS — Z79899 Other long term (current) drug therapy: Secondary | ICD-10-CM | POA: Insufficient documentation

## 2017-08-10 DIAGNOSIS — N760 Acute vaginitis: Secondary | ICD-10-CM

## 2017-08-10 DIAGNOSIS — F17210 Nicotine dependence, cigarettes, uncomplicated: Secondary | ICD-10-CM | POA: Insufficient documentation

## 2017-08-10 LAB — URINALYSIS, ROUTINE W REFLEX MICROSCOPIC
BILIRUBIN URINE: NEGATIVE
Glucose, UA: NEGATIVE mg/dL
Hgb urine dipstick: NEGATIVE
Ketones, ur: NEGATIVE mg/dL
NITRITE: NEGATIVE
PH: 6 (ref 5.0–8.0)
Protein, ur: 30 mg/dL — AB

## 2017-08-10 LAB — WET PREP, GENITAL
Sperm: NONE SEEN
TRICH WET PREP: NONE SEEN
YEAST WET PREP: NONE SEEN

## 2017-08-10 LAB — PREGNANCY, URINE: PREG TEST UR: NEGATIVE

## 2017-08-10 LAB — URINALYSIS, MICROSCOPIC (REFLEX)

## 2017-08-10 NOTE — ED Triage Notes (Signed)
Vaginal discharge 4 days. This am it is so bad she has to wear a pad.

## 2017-08-11 MED ORDER — AZITHROMYCIN 250 MG PO TABS
1000.0000 mg | ORAL_TABLET | Freq: Once | ORAL | Status: AC
  Administered 2017-08-11: 1000 mg via ORAL
  Filled 2017-08-11: qty 4

## 2017-08-11 MED ORDER — CEFTRIAXONE SODIUM 250 MG IJ SOLR
250.0000 mg | Freq: Once | INTRAMUSCULAR | Status: AC
  Administered 2017-08-11: 250 mg via INTRAMUSCULAR
  Filled 2017-08-11: qty 250

## 2017-08-11 MED ORDER — METRONIDAZOLE 500 MG PO TABS: 500.0000 mg | ORAL_TABLET | Freq: Two times a day (BID) | ORAL | 0 refills | Status: DC

## 2017-08-11 NOTE — Discharge Instructions (Signed)
Take Flagyl as prescribed.  Do not drink alcohol while taking this medicine. Do not douche, washout, or introduce any new substances to the vagina, as this could cause worsening symptoms. You were treated for gonorrhea and chlamydia today.  The results are pending.  You will receive a phone call in 3 days if the results are positive.  If they are positive, you should have your partner follow-up for treatment.  Do not have unprotected sexual intercourse in the next 2 weeks. Follow-up with OB/GYN or the health department for further evaluation and discussion about your recurrent BV. The health department will be able to test and treat your discharge free of cost. Return to the emergency room if you develop fevers, vomiting, worsening abdominal pain, worsening discharge, or any new or worsening symptoms.

## 2017-08-11 NOTE — ED Notes (Signed)
Pt verbalizes understanding of d/c instructions and denies any further needs at this time. 

## 2017-08-11 NOTE — ED Provider Notes (Signed)
[facility] EMERGENCY DEPARTMENT Provider Note   CSN: [id number] Arrival date & time: 08/10/17  2121     History   Chief Complaint Chief Complaint  Patient presents with  . Vaginal Discharge    HPI Molly Mcknight is a 31 y.o. female presenting with vaginal discharge.  Patient states that since finishing her period 4 days ago, she has had vaginal discharge which is steadily worsening.  The discharge is a thin yellowish color.  She states she frequently gets discharge, and is often diagnosed with BV.  She has been trying to be more vigilant about cleaning her vagina to prevent this.  She denies fevers, chills, nausea, vomiting, abdominal pain, or abnormal bowel movements.  She reports increased urinary frequency, denies hematuria or dysuria.  Last antibiotic use was 11/04.  She denies other medical history.  She does not have an OB/GYN.  She is sexually active with one female partner.  He does not have any symptoms.  HPI  Past Medical History:  Diagnosis Date  . Anemia     There are no active problems to display for this patient.   Past Surgical History:  Procedure Laterality Date  . AMPUTATION FINGER Right   . CHOLECYSTECTOMY    . TUBAL LIGATION      OB History    Gravida Para Term Preterm AB Living   3 1 1  0 1 1   SAB TAB Ectopic Multiple Live Births   1 0 0 0 1       Home Medications    Prior to Admission medications   Medication Sig Start Date End Date Taking? Authorizing Provider  Acetaminophen (TYLENOL 8 HOUR PO) Take by mouth.    [provider]  Cephalexin (KEFLEX PO) Take by mouth.    [provider]  doxycycline (VIBRAMYCIN) 100 MG capsule Take 1 capsule (100 mg total) 2 (two) times daily by mouth. One po bid x 7 days 06/27/17   Fayrene Helper, PA-C  metroNIDAZOLE (FLAGYL) 500 MG tablet Take 1 tablet (500 mg total) by mouth 2 (two) times daily. One po bid x 7 days 09/10/16   Geoffery Lyons, MD  metroNIDAZOLE (FLAGYL) 500 MG tablet  Take 1 tablet (500 mg total) by mouth 2 (two) times daily. One po bid x 7 days 06/23/17   Dartha Lodge, PA-C  metroNIDAZOLE (FLAGYL) 500 MG tablet Take 1 tablet (500 mg total) by mouth 2 (two) times daily. 08/11/17   Zanden Colver, PA-C  naproxen (NAPROSYN) 500 MG tablet Take 1 tablet (500 mg total) by mouth 2 (two) times daily. 11/12/16   Gwyneth Sprout, MD    Family History No family history on file.  Social History Social History   Tobacco Use  . Smoking status: Current Some Day Smoker    Packs/day: 0.50    Types: Cigarettes  . Smokeless tobacco: Never Used  Substance Use Topics  . Alcohol use: No  . Drug use: No     Allergies   Patient has no known allergies.   Review of Systems Review of Systems  Constitutional: Negative for chills and fever.  HENT: Negative for congestion.   Respiratory: Negative for cough and shortness of breath.   Cardiovascular: Negative for chest pain.  Gastrointestinal: Negative for abdominal pain, nausea and vomiting.  Genitourinary: Positive for frequency and vaginal discharge. Negative for dysuria and hematuria.  Musculoskeletal: Negative for back pain.  Skin: Negative for rash.  Allergic/Immunologic: Negative for immunocompromised state.  Hematological: Does not  bruise/bleed easily.     Physical Exam Updated Vital Signs BP 112/75   Pulse (!) 55   Temp 98.2 F (36.8 C) (Oral)   Resp 16   Ht 5\' 1"  (1.549 m)   Wt 90.7 kg (200 lb)   LMP 07/28/2017   SpO2 100%   BMI 37.79 kg/m   Physical Exam  Constitutional: She is oriented to person, place, and time. She appears well-developed and well-nourished. No distress.  HENT:  Head: Normocephalic and atraumatic.  Eyes: EOM are normal.  Neck: Normal range of motion.  Cardiovascular: Normal rate, regular rhythm and intact distal pulses.  Pulmonary/Chest: Effort normal and breath sounds normal. No respiratory distress. She has no wheezes.  Abdominal: Soft. Bowel sounds are normal.  She exhibits no distension and no mass. There is no tenderness. There is no guarding.  No TTP of abd. abd is soft nontender. No rigidity or guarding  Genitourinary: Rectum normal, vagina normal and uterus normal. Pelvic exam was performed with patient supine. There is no rash, tenderness or lesion on the right labia. There is no rash, tenderness, lesion or injury on the left labia. Cervix exhibits discharge. Cervix exhibits no motion tenderness and no friability. Right adnexum displays no mass and no tenderness. Left adnexum displays no mass and no tenderness.  Genitourinary Comments: Chaperone present.  Thin yellow discharge from the cervix.  No cervical motion tenderness.  Musculoskeletal: Normal range of motion.  Neurological: She is alert and oriented to person, place, and time.  Skin: Skin is warm and dry.  Psychiatric: She has a normal mood and affect.  Nursing note and vitals reviewed.    ED Treatments / Results  Labs (all labs ordered are listed, but only abnormal results are displayed) Labs Reviewed  WET PREP, GENITAL - Abnormal; Notable for the following components:      Result Value   Clue Cells Wet Prep HPF POC PRESENT (*)    WBC, Wet Prep HPF POC MANY (*)    All other components within normal limits  URINALYSIS, ROUTINE W REFLEX MICROSCOPIC - Abnormal; Notable for the following components:   APPearance CLOUDY (*)    Specific Gravity, Urine >1.030 (*)    Protein, ur 30 (*)    Leukocytes, UA MODERATE (*)    All other components within normal limits  URINALYSIS, MICROSCOPIC (REFLEX) - Abnormal; Notable for the following components:   Bacteria, UA MANY (*)    Squamous Epithelial / LPF 6-30 (*)    All other components within normal limits  PREGNANCY, URINE    EKG  EKG Interpretation None       Radiology No results found.  Procedures Procedures (including critical care time)  Medications Ordered in ED Medications  cefTRIAXone (ROCEPHIN) injection 250 mg (250 mg  Intramuscular Given 08/11/17 0107)  azithromycin (ZITHROMAX) tablet 1,000 mg (1,000 mg Oral Given 08/11/17 0106)     Initial Impression / Assessment and Plan / ED Course  I have reviewed the triage vital signs and the nursing notes.  Pertinent labs & imaging results that were available during my care of the patient were reviewed by me and considered in my medical decision making (see chart for details).     Patient presenting for evaluation of vaginal discharge.  Physical exam reassuring, she appears nontoxic.  No abdominal tenderness.  No cervical motion tenderness.  Pelvic exam shows thin yellow discharge.  Patient elects to be treated for gonorrhea and chlamydia today.  Wet prep positive for clue cells.  UA without UTI.  Patient instructed on Flagyl use.  Patient instructed to follow-up with OB/GYN or the health department for further evaluation of recurrent BV.  Discussed safe sex practices.  Discussed patient is to stop douching or cleaning the vagina as this causes worsening BV.  At this time, patient appears safe for discharge.  Return precautions given.  Patient states she understands and agrees to plan.  Final Clinical Impressions(s) / ED Diagnoses   Final diagnoses:  BV (bacterial vaginosis)  Vaginal discharge    ED Discharge Orders        Ordered    metroNIDAZOLE (FLAGYL) 500 MG tablet  2 times daily     08/11/17 0044       Alveria ApleyCaccavale, Dionna Wiedemann, PA-C 08/11/17 0217    Palumbo, April, MD 08/11/17 [phone]

## 2017-12-15 ENCOUNTER — Encounter (HOSPITAL_BASED_OUTPATIENT_CLINIC_OR_DEPARTMENT_OTHER): Payer: Self-pay

## 2017-12-15 ENCOUNTER — Emergency Department (HOSPITAL_BASED_OUTPATIENT_CLINIC_OR_DEPARTMENT_OTHER)
Admission: EM | Admit: 2017-12-15 | Discharge: 2017-12-15 | Disposition: A | Discharge status: Home / Self Care | Payer: Self-pay | Attending: Emergency Medicine | Admitting: Emergency Medicine

## 2017-12-15 ENCOUNTER — Other Ambulatory Visit: Payer: Self-pay

## 2017-12-15 DIAGNOSIS — Z79899 Other long term (current) drug therapy: Secondary | ICD-10-CM | POA: Insufficient documentation

## 2017-12-15 DIAGNOSIS — Z9049 Acquired absence of other specified parts of digestive tract: Secondary | ICD-10-CM | POA: Insufficient documentation

## 2017-12-15 DIAGNOSIS — R102 Pelvic and perineal pain: Secondary | ICD-10-CM | POA: Insufficient documentation

## 2017-12-15 DIAGNOSIS — F17290 Nicotine dependence, other tobacco product, uncomplicated: Secondary | ICD-10-CM | POA: Insufficient documentation

## 2017-12-15 LAB — URINALYSIS, ROUTINE W REFLEX MICROSCOPIC
Bilirubin Urine: NEGATIVE
Glucose, UA: NEGATIVE mg/dL
Hgb urine dipstick: NEGATIVE
Ketones, ur: 15 mg/dL — AB
Leukocytes, UA: NEGATIVE
NITRITE: NEGATIVE
Protein, ur: NEGATIVE mg/dL
pH: 6 (ref 5.0–8.0)

## 2017-12-15 LAB — WET PREP, GENITAL
TRICH WET PREP: NONE SEEN
YEAST WET PREP: NONE SEEN

## 2017-12-15 LAB — PREGNANCY, URINE: Preg Test, Ur: NEGATIVE

## 2017-12-15 MED ORDER — DOXYCYCLINE HYCLATE 100 MG PO CAPS: 100.0000 mg | ORAL_CAPSULE | Freq: Two times a day (BID) | ORAL | 0 refills | Status: DC

## 2017-12-15 MED ORDER — METRONIDAZOLE 500 MG PO TABS
500.0000 mg | ORAL_TABLET | Freq: Once | ORAL | Status: AC
  Administered 2017-12-15: 500 mg via ORAL
  Filled 2017-12-15: qty 1

## 2017-12-15 MED ORDER — CEFTRIAXONE SODIUM 250 MG IJ SOLR
250.0000 mg | Freq: Once | INTRAMUSCULAR | Status: AC
  Administered 2017-12-15: 250 mg via INTRAMUSCULAR
  Filled 2017-12-15: qty 250

## 2017-12-15 MED ORDER — DOXYCYCLINE HYCLATE 100 MG PO TABS
100.0000 mg | ORAL_TABLET | Freq: Once | ORAL | Status: AC
  Administered 2017-12-15: 100 mg via ORAL
  Filled 2017-12-15: qty 1

## 2017-12-15 MED ORDER — METRONIDAZOLE 500 MG PO TABS: 500.0000 mg | ORAL_TABLET | Freq: Two times a day (BID) | ORAL | 0 refills | Status: AC

## 2017-12-15 MED ORDER — POLYETHYLENE GLYCOL 3350 17 GM/SCOOP PO POWD: 17.0000 g | Freq: Every day | ORAL | 0 refills | Status: DC

## 2017-12-15 NOTE — Discharge Instructions (Addendum)
You are being treated for pelvic inflammatory disease today.  This is a treatment we do prophylactically.  He will take these antibiotics for 2 weeks.  Please take a probiotic such as yogurt, or Culturelle over-the-counter will taking his antibiotics to protect her stomach.  Some people experience diarrhea from antibiotics so this will help prevent it.  Do not have unprotected sexual intercourse for 1 week.  Use a condom with every sexual encounter Please follow-up with primary care listed in this paperwork for further workup for your dizziness.  It is also reporting that she obtain a primary care provider or an OB/GYN to keep you up-to-date on her Pap smears.  Please return to the ER for worsening symptoms, high fevers or persistent vomiting. You have been tested for HIV, syphilis, chlamydia and gonorrhea. These results will be available in approximately 3 days. You will be notified if they are positive.   It is very important to practice safe sex and use condoms when sexually active. If your results are positive you need to notify all sexual partners so they can be treated as well. The website https://garcia.[email or web address]://www.dontspreadit.com/ can be used to send anonymous text messages or emails to alert sexual contacts.   SEEK IMMEDIATE MEDICAL CARE IF:  You develop an oral temperature above 102 F (38.9 C), not controlled by medications or lasting more than 2 days.  You develop an increase in pain.  You develop vaginal bleeding and it is not time for your period.  You develop painful intercourse.    I am also treating you with MiraLAX to have soft bowel movements.  Please take 1 capful daily until you have soft bowel movements daily.  For your dizziness, please return if you have any chest pain, shortness of breath, difficulty breathing, focal symptoms such as difficulty with speech, swallowing, weakness or numbness on one side of the body.  I also recommend that you urgently find primary care, and have this  assessed and to avoid driving until that time.

## 2017-12-15 NOTE — ED Triage Notes (Signed)
C/o vaginal d/c x 2 weeks-NAD-steady gait 

## 2017-12-16 LAB — GC/CHLAMYDIA PROBE AMP (~~LOC~~) NOT AT ARMC
CHLAMYDIA, DNA PROBE: NEGATIVE
Neisseria Gonorrhea: NEGATIVE

## 2017-12-16 NOTE — ED Provider Notes (Addendum)
[facility] EMERGENCY DEPARTMENT Provider Note   CSN: [id number] Arrival date & time: 12/15/17  1720     History   Chief Complaint Chief Complaint  Patient presents with  . Vaginal Discharge    HPI Molly Mcknight is a 32 y.o. female.  HPI  Patient is a 32 year old female with a history of anemia presenting for vaginal discharge and pelvic pain.  Patient reports that she ended her menstrual cycle approximately 1.5 weeks ago and subsequently noted a heavy and malodorous vaginal discharge since.  Patient also noting that around that time, she developed diffuse pelvic pain that she feels is sharp and burning in nature on bilateral sides.  Pain does not extend to the flanks.  Patient reports she is ultimately nauseous, but is not vomiting.  Patient denies any upper abdominal pain.  Patient denies any dysuria, urgency, or frequency.  Last bowel movement 3 days ago, and hard and painful.  Patient reports she is sexually active with one female partner for the past 5 months, and is unsure about possible STI exposure.  Patient has a history of multiple STI exposures.  Patient reports taking Tylenol for the pain.  Patient also noting that for the past 2-3 months, she has had episodes of feeling that her environment is spinning, particularly only occurring while driving.  Patient denies any visual disturbance, changes in speech, weakness or numbness in extremities, or gait disturbance.  No chest pain, shortness of breath, or palpitations at the time of these incidents.  Patient reporting that these episodes only last a couple minutes at a time and spontaneously resolved.  They are not associated with any particular turning of the head that she can identify.  Patient reports that she does not know when these episodes will occur, and she can go several days without experiencing an episode.  Past Medical History:  Diagnosis Date  . Anemia     There are no active problems to display for this  patient.   Past Surgical History:  Procedure Laterality Date  . AMPUTATION FINGER Right   . CHOLECYSTECTOMY    . TUBAL LIGATION       OB History    Gravida  3   Para  1   Term  1   Preterm  0   AB  1   Living  1     SAB  1   TAB  0   Ectopic  0   Multiple  0   Live Births  1            Home Medications    Prior to Admission medications   Medication Sig Start Date End Date Taking? Authorizing Provider  Acetaminophen (TYLENOL 8 HOUR PO) Take by mouth.    [provider]  Cephalexin (KEFLEX PO) Take by mouth.    [provider]  doxycycline (VIBRAMYCIN) 100 MG capsule Take 1 capsule (100 mg total) by mouth 2 (two) times daily. You were given one dose in the ED. 12/15/17   Aviva Kluver B, PA-C  metroNIDAZOLE (FLAGYL) 500 MG tablet Take 1 tablet (500 mg total) by mouth 2 (two) times daily for 14 days. 12/15/17 12/29/17  Aviva Kluver B, PA-C  naproxen (NAPROSYN) 500 MG tablet Take 1 tablet (500 mg total) by mouth 2 (two) times daily. 11/12/16   Gwyneth Sprout, MD  polyethylene glycol powder (GLYCOLAX/MIRALAX) powder Take 17 g by mouth daily. Please take until you are having soft bowel movements daily.  17  g is 1 capful. 12/15/17   Elisha PonderMurray, Muranda Coye B, PA-C    Family History No family history on file.  Social History Social History   Tobacco Use  . Smoking status: Current Some Day Smoker    Packs/day: 0.50    Types: E-cigarettes  . Smokeless tobacco: Never Used  Substance Use Topics  . Alcohol use: Yes    Comment: occ  . Drug use: No     Allergies   Patient has no known allergies.   Review of Systems Review of Systems  Constitutional: Negative for chills and fever.  HENT: Negative for sore throat.   Eyes: Negative for visual disturbance.  Respiratory: Negative for chest tightness.   Cardiovascular: Negative for chest pain and palpitations.  Gastrointestinal: Positive for abdominal pain. Negative for nausea and vomiting.    Genitourinary: Positive for pelvic pain and vaginal discharge. Negative for difficulty urinating, dysuria, flank pain, genital sores, vaginal bleeding and vaginal pain.  Musculoskeletal: Negative for myalgias.  Skin: Negative for rash.  Neurological: Positive for dizziness. Negative for syncope, weakness, light-headedness, numbness and headaches.     Physical Exam Updated Vital Signs BP 108/83 (BP Location: Right Arm)   Pulse 61   Temp 98.7 F (37.1 C) (Oral)   Resp 16   Ht 5\' 1"  (1.549 m)   Wt 90.7 kg (200 lb)   LMP 12/04/2017   SpO2 100%   BMI 37.79 kg/m   Physical Exam  Constitutional: She appears well-developed and well-nourished. No distress.  HENT:  Head: Normocephalic and atraumatic.  Mouth/Throat: Oropharynx is clear and moist.  Eyes: Pupils are equal, round, and reactive to light. Conjunctivae and EOM are normal.  Neck: Normal range of motion. Neck supple.  Cardiovascular: Normal rate, regular rhythm, S1 normal and S2 normal.  No murmur heard. Pulmonary/Chest: Effort normal and breath sounds normal. She has no wheezes. She has no rales.  Abdominal: Soft. Bowel sounds are normal. She exhibits no distension. There is tenderness. There is no guarding.  General discomfort to palpation of the abdomen without focality.  No guarding or rebound.  Genitourinary:  Genitourinary Comments: Examination performed with RN chaperone, sitter, present.  There are no external lesions of the vagina or perineum.  No inguinal lymphadenopathy.  No lesions of the vaginal canal.  Cervix is not erythematous and non-friable.  There is copious milky white discharge surrounding cervical os and in vaginal canal.  Bimanual exam demonstrates mild CMT. No focal adnexal tenderness. Generalized tenderness to palpation of uterine fundus.   Musculoskeletal: Normal range of motion. She exhibits no edema or deformity.  Lymphadenopathy:    She has no cervical adenopathy.  Neurological: She is alert.   Cranial nerves grossly intact. Patient moves extremities symmetrically and with good coordination.  Skin: Skin is warm and dry. No rash noted. No erythema.  Psychiatric: She has a normal mood and affect. Her behavior is normal. Judgment and thought content normal.  Nursing note and vitals reviewed.    ED Treatments / Results  Labs (all labs ordered are listed, but only abnormal results are displayed) Labs Reviewed  WET PREP, GENITAL - Abnormal; Notable for the following components:      Result Value   Clue Cells Wet Prep HPF POC PRESENT (*)    WBC, Wet Prep HPF POC MANY (*)    All other components within normal limits  URINALYSIS, ROUTINE W REFLEX MICROSCOPIC - Abnormal; Notable for the following components:   Specific Gravity, Urine >1.030 (*)  Ketones, ur 15 (*)    All other components within normal limits  PREGNANCY, URINE  HIV ANTIBODY (ROUTINE TESTING)  RPR  GC/CHLAMYDIA PROBE AMP ([place]) NOT AT [facility]    EKG EKG Interpretation  Date/Time:  Wednesday December 15 2017 18:51:30 EDT Ventricular Rate:  52 PR Interval:    QRS Duration: 95 QT Interval:  452 QTC Calculation: 421 R Axis:   30 Text Interpretation:  Sinus rhythm No significant change since last tracing Confirmed by Alvira Monday ([id number]) on 12/15/2017 7:22:54 PM   Radiology No results found.  Procedures Procedures (including critical care time)  Medications Ordered in ED Medications  cefTRIAXone (ROCEPHIN) injection 250 mg (250 mg Intramuscular Given 12/15/17 1902)  doxycycline (VIBRA-TABS) tablet 100 mg (100 mg Oral Given 12/15/17 1901)  metroNIDAZOLE (FLAGYL) tablet 500 mg (500 mg Oral Given 12/15/17 1901)     Initial Impression / Assessment and Plan / ED Course  I have reviewed the triage vital signs and the nursing notes.  Pertinent labs & imaging results that were available during my care of the patient were reviewed by me and considered in my medical decision making (see chart for  details).     Patient nontoxic-appearing, afebrile, and in no acute distress.  Differential diagnosis includes pelvic inflammatory disease, abdominal pain, constipation, pyelonephritis.  Urinalysis negative for infection.  Urine pregnancy negative.  Examination most concerning for pelvic inflammatory disease in setting of increased vaginal discharge and generalized lower abdominal pain.  Doubt TOA, as patient has no focal adnexal tenderness, is nontoxic appearing, nontachycardic. Patient also exhibits many WBCs, and clue cells on her wet prep.  Will treat for pelvic inflammatory disease with ceftriaxone, doxycycline, and Flagyl.  Patient instructed  to avoid direct sunlight during doxycycline therapy.  Additionally, patient instructed to avoid alcohol while taking Flagyl.  Patient instructed to return to the emergency department for any worsening abdominal pain, focal abdominal pain, intractable nausea or vomiting, or fever chills with abdominal pain.  Patient in understanding and agrees with the plan of care.  I discussed the differential diagnosis of dizziness with the patient.  Patient not having symptoms for several days.  I discussed with the patient that her symptoms are concerning given that they occur when driving, and it is of high priority that she see the primary care resources provided to her in order to receive possible outpatient workup.  Given that symptoms are intermittent, and occurred at rest, doubt anemia as cause of patient's symptoms.  EKG demonstrating normal sinus rhythm without evidence of ischemia, infarction, or arrhythmia today.  Patient encouraged to receive rides her mother's, and limit driving until she is able to discuss her dizziness with primary care provider.  Final Clinical Impressions(s) / ED Diagnoses   Final diagnoses:  Pelvic pain    ED Discharge Orders        Ordered    doxycycline (VIBRAMYCIN) 100 MG capsule  2 times daily     12/15/17 1919     metroNIDAZOLE (FLAGYL) 500 MG tablet  2 times daily     12/15/17 1919    polyethylene glycol powder (GLYCOLAX/MIRALAX) powder  Daily     12/15/17 1919       Delia Chimes 12/16/17 0128    Aviva Kluver B, PA-C 12/16/17 0355    Alvira Monday, MD 12/18/17 2159

## 2017-12-17 LAB — HIV ANTIBODY (ROUTINE TESTING W REFLEX): HIV Screen 4th Generation wRfx: NONREACTIVE

## 2017-12-17 LAB — RPR: RPR Ser Ql: NONREACTIVE

## 2018-07-01 ENCOUNTER — Encounter (HOSPITAL_BASED_OUTPATIENT_CLINIC_OR_DEPARTMENT_OTHER): Payer: Self-pay

## 2018-07-01 ENCOUNTER — Other Ambulatory Visit: Payer: Self-pay

## 2018-07-01 ENCOUNTER — Emergency Department (HOSPITAL_BASED_OUTPATIENT_CLINIC_OR_DEPARTMENT_OTHER)
Admission: EM | Admit: 2018-07-01 | Discharge: 2018-07-01 | Disposition: A | Discharge status: Home / Self Care | Payer: 59 | Attending: Emergency Medicine | Admitting: Emergency Medicine

## 2018-07-01 DIAGNOSIS — Z79899 Other long term (current) drug therapy: Secondary | ICD-10-CM | POA: Insufficient documentation

## 2018-07-01 DIAGNOSIS — B9689 Other specified bacterial agents as the cause of diseases classified elsewhere: Secondary | ICD-10-CM | POA: Diagnosis not present

## 2018-07-01 DIAGNOSIS — N760 Acute vaginitis: Secondary | ICD-10-CM | POA: Insufficient documentation

## 2018-07-01 DIAGNOSIS — N390 Urinary tract infection, site not specified: Secondary | ICD-10-CM | POA: Diagnosis not present

## 2018-07-01 DIAGNOSIS — Z87891 Personal history of nicotine dependence: Secondary | ICD-10-CM | POA: Insufficient documentation

## 2018-07-01 DIAGNOSIS — R102 Pelvic and perineal pain: Secondary | ICD-10-CM | POA: Diagnosis present

## 2018-07-01 LAB — PREGNANCY, URINE: PREG TEST UR: NEGATIVE

## 2018-07-01 LAB — WET PREP, GENITAL
Sperm: NONE SEEN
Trich, Wet Prep: NONE SEEN
Yeast Wet Prep HPF POC: NONE SEEN

## 2018-07-01 LAB — URINALYSIS, ROUTINE W REFLEX MICROSCOPIC
BILIRUBIN URINE: NEGATIVE
Glucose, UA: NEGATIVE mg/dL
KETONES UR: NEGATIVE mg/dL
Nitrite: NEGATIVE
PROTEIN: 30 mg/dL — AB
Specific Gravity, Urine: 1.025 (ref 1.005–1.030)
pH: 6 (ref 5.0–8.0)

## 2018-07-01 LAB — URINALYSIS, MICROSCOPIC (REFLEX): WBC, UA: >50 WBC/hpf (ref 0–5)

## 2018-07-01 MED ORDER — KETOROLAC TROMETHAMINE 30 MG/ML IJ SOLN
30.0000 mg | Freq: Once | INTRAMUSCULAR | Status: AC
  Administered 2018-07-01: 30 mg via INTRAMUSCULAR
  Filled 2018-07-01: qty 1

## 2018-07-01 MED ORDER — AZITHROMYCIN 250 MG PO TABS
1000.0000 mg | ORAL_TABLET | Freq: Once | ORAL | Status: AC
  Administered 2018-07-01: 1000 mg via ORAL
  Filled 2018-07-01: qty 4

## 2018-07-01 MED ORDER — CEPHALEXIN 500 MG PO CAPS: 500.0000 mg | ORAL_CAPSULE | Freq: Four times a day (QID) | ORAL | 0 refills | Status: DC

## 2018-07-01 MED ORDER — CEFTRIAXONE SODIUM 250 MG IJ SOLR
250.0000 mg | Freq: Once | INTRAMUSCULAR | Status: AC
  Administered 2018-07-01: 250 mg via INTRAMUSCULAR
  Filled 2018-07-01: qty 250

## 2018-07-01 MED ORDER — METRONIDAZOLE 500 MG PO TABS: 500.0000 mg | ORAL_TABLET | Freq: Two times a day (BID) | ORAL | 0 refills | Status: DC

## 2018-07-01 NOTE — ED Provider Notes (Addendum)
[facility] EMERGENCY DEPARTMENT Provider Note   CSN: [id number] Arrival date & time: 07/01/18  1848     History   Chief Complaint Chief Complaint  Patient presents with  . Vaginal Discharge    HPI Molly Mcknight is a 32 y.o. female who presents to ED for 2-day history of pelvic pain and yellow vaginal discharge.  States that she completed her normal menstrual cycle 2 days ago.  She has had some similar cramping during and after her menstrual cycle concludes in the past.  However, she is concerned because she is also having urinary frequency, dysuria and "like pink yellow ball of mucus when I wiped."  States that she is sexually active with and without protection but none in the past several weeks.  She denies possibility of pregnancy as she has a tubal ligation done.  Denies any abnormal vaginal bleeding, fevers, changes to bowel movements, nausea, vomiting, new soaps, lotions or detergents, rashes.  HPI  Past Medical History:  Diagnosis Date  . Anemia     There are no active problems to display for this patient.   Past Surgical History:  Procedure Laterality Date  . AMPUTATION FINGER Right   . CHOLECYSTECTOMY    . TUBAL LIGATION       OB History    Gravida  3   Para  1   Term  1   Preterm  0   AB  1   Living  1     SAB  1   TAB  0   Ectopic  0   Multiple  0   Live Births  1            Home Medications    Prior to Admission medications   Medication Sig Start Date End Date Taking? Authorizing Provider  Acetaminophen (TYLENOL 8 HOUR PO) Take by mouth.    [provider]  cephALEXin (KEFLEX) 500 MG capsule Take 1 capsule (500 mg total) by mouth 4 (four) times daily. 07/01/18   Janeece Blok, PA-C  doxycycline (VIBRAMYCIN) 100 MG capsule Take 1 capsule (100 mg total) by mouth 2 (two) times daily. You were given one dose in the ED. 12/15/17   Aviva Kluver B, PA-C  metroNIDAZOLE (FLAGYL) 500 MG tablet Take 1 tablet (500 mg total)  by mouth 2 (two) times daily. 07/01/18   Lucyann Romano, PA-C  naproxen (NAPROSYN) 500 MG tablet Take 1 tablet (500 mg total) by mouth 2 (two) times daily. 11/12/16   Gwyneth Sprout, MD  polyethylene glycol powder (GLYCOLAX/MIRALAX) powder Take 17 g by mouth daily. Please take until you are having soft bowel movements daily.  17 g is 1 capful. 12/15/17   Elisha Ponder, PA-C    Family History No family history on file.  Social History Social History   Tobacco Use  . Smoking status: Former Smoker    Packs/day: 0.50  . Smokeless tobacco: Never Used  Substance Use Topics  . Alcohol use: Yes    Comment: occ  . Drug use: No     Allergies   Patient has no known allergies.   Review of Systems Review of Systems  Constitutional: Negative for appetite change, chills and fever.  HENT: Negative for ear pain, rhinorrhea, sneezing and sore throat.   Eyes: Negative for photophobia and visual disturbance.  Respiratory: Negative for cough, chest tightness, shortness of breath and wheezing.   Cardiovascular: Negative for chest pain and palpitations.  Gastrointestinal: Negative for abdominal pain,  blood in stool, constipation, diarrhea, nausea and vomiting.  Genitourinary: Positive for dysuria, frequency, pelvic pain and vaginal discharge. Negative for hematuria and urgency.  Musculoskeletal: Negative for myalgias.  Skin: Negative for rash.  Neurological: Negative for dizziness, weakness and light-headedness.     Physical Exam Updated Vital Signs BP 112/62 (BP Location: Left Arm)   Pulse (!) 56   Temp 98.7 F (37.1 C) (Oral)   Resp 16   Ht 5\' 1"  (1.549 m)   Wt 95 kg   LMP 06/25/2018   SpO2 100%   BMI 39.57 kg/m   Physical Exam  Constitutional: She appears well-developed and well-nourished. No distress.  HENT:  Head: Normocephalic and atraumatic.  Nose: Nose normal.  Eyes: Conjunctivae and EOM are normal. Right eye exhibits no discharge. Left eye exhibits no discharge. No  scleral icterus.  Neck: Normal range of motion. Neck supple.  Cardiovascular: Normal rate, regular rhythm, normal heart sounds and intact distal pulses. Exam reveals no gallop and no friction rub.  No murmur heard. Pulmonary/Chest: Effort normal and breath sounds normal. No respiratory distress.  Abdominal: Soft. Bowel sounds are normal. She exhibits no distension. There is no tenderness. There is no guarding.  No abdominal TTP.  No CVA tenderness bilaterally.  Genitourinary: Vaginal discharge found.  Genitourinary Comments: Pelvic exam: normal external genitalia without evidence of trauma. VULVA: normal appearing vulva with no masses, tenderness or lesion. VAGINA: normal appearing vagina with normal color and discharge, no lesions. CERVIX: normal appearing cervix without lesions, cervical motion tenderness absent, cervical os closed with out purulent discharge; No vaginal discharge. Wet prep and DNA probe for chlamydia and GC obtained.   ADNEXA: normal adnexa in size, nontender and no masses UTERUS: uterus is normal size, shape, consistency and nontender.   Sharyl Nimrod, RN served as Biomedical engineer for exam.  Musculoskeletal: Normal range of motion. She exhibits no edema.  Neurological: She is alert. She exhibits normal muscle tone. Coordination normal.  Skin: Skin is warm and dry. No rash noted.  Psychiatric: She has a normal mood and affect.  Nursing note and vitals reviewed.    ED Treatments / Results  Labs (all labs ordered are listed, but only abnormal results are displayed) Labs Reviewed  WET PREP, GENITAL - Abnormal; Notable for the following components:      Result Value   Clue Cells Wet Prep HPF POC PRESENT (*)    WBC, Wet Prep HPF POC MANY (*)    All other components within normal limits  URINALYSIS, ROUTINE W REFLEX MICROSCOPIC - Abnormal; Notable for the following components:   APPearance CLOUDY (*)    Hgb urine dipstick LARGE (*)    Protein, ur 30 (*)    Leukocytes, UA  MODERATE (*)    All other components within normal limits  URINALYSIS, MICROSCOPIC (REFLEX) - Abnormal; Notable for the following components:   Bacteria, UA MANY (*)    All other components within normal limits  PREGNANCY, URINE  GC/CHLAMYDIA PROBE AMP ([place]) NOT AT [facility]    EKG None  Radiology No results found.  Procedures Procedures (including critical care time)  Medications Ordered in ED Medications  ketorolac (TORADOL) 30 MG/ML injection 30 mg (30 mg Intramuscular Given 07/01/18 1950)  cefTRIAXone (ROCEPHIN) injection 250 mg (250 mg Intramuscular Given 07/01/18 1951)  azithromycin (ZITHROMAX) tablet 1,000 mg (1,000 mg Oral Given 07/01/18 1950)     Initial Impression / Assessment and Plan / ED Course  I have reviewed the triage vital signs and the  nursing notes.  Pertinent labs & imaging results that were available during my care of the patient were reviewed by me and considered in my medical decision making (see chart for details).  Clinical Course as of Jul 01 2006  Fri Jul 01, 2018  1942 Bacteria, UA(!): MANY [HK]  1942 Leukocytes, UA(!): MODERATE [HK]  1942 WBC, UA: >50 [HK]  2003 Clue Cells Wet Prep HPF POC(!): PRESENT [HK]    Clinical Course User Index [HK] Dietrich Pates, PA-C    32 year old female presents to ED for 2-day history of lower pelvic pain, dysuria, urinary frequency and vaginal discharge.  Patient has had several visits in the past for vaginal discharge and pelvic pain.  She is concerned because she had a "mucus" clot when she wiped prior to arrival.  She is afebrile with no recent use of antipyretics.  Pelvic exam revealed white vaginal discharge and suprapubic tenderness to palpation.  Otherwise unremarkable.  Urinalysis significant for pyuria, moderate leukocytes, many bacteria and 0-5 squamous cells.  Wet prep shows clue cells, WBCs.  Patient reports improvement in her symptoms with Toradol given.  Patient treated prophylactically for gonorrhea  and chlamydia, will be treated with Keflex for UTI based on urinalysis findings and symptoms.  Will give Flagyl for BV.  Will advise her to follow-up with remaining lab work when it is available.  Suspect that her suprapubic discomfort is secondary to her UTI.  Doubt pyelonephritis, ovarian torsion or other surgical/emergent cause of symptoms.  Patient is hemodynamically stable, in NAD, and able to ambulate in the ED. Evaluation does not show pathology that would require ongoing emergent intervention or inpatient treatment. I explained the diagnosis to the patient. Pain has been managed and has no complaints prior to discharge. Patient is comfortable with above plan and is stable for discharge at this time. All questions were answered prior to disposition. Strict return precautions for returning to the ED were discussed. Encouraged follow up with PCP.    Portions of this note were generated with Scientist, clinical (histocompatibility and immunogenetics). Dictation errors may occur despite best attempts at proofreading.   Final Clinical Impressions(s) / ED Diagnoses   Final diagnoses:  Lower urinary tract infectious disease  Bacterial vaginosis    ED Discharge Orders         Ordered    cephALEXin (KEFLEX) 500 MG capsule  4 times daily     07/01/18 2002    metroNIDAZOLE (FLAGYL) 500 MG tablet  2 times daily     07/01/18 2002               Dietrich Pates, Cordelia Poche 07/01/18 Farley Ly, MD 07/01/18 2332

## 2018-07-01 NOTE — ED Triage Notes (Signed)
C/o vaginal d/x x 2 days-started after LMP-pelvic pain-NAD-steady gait

## 2018-07-01 NOTE — Discharge Instructions (Addendum)
Your urinalysis showed that you had an infection. Your wet prep showed that you have bacterial vaginosis. We will contact you with the results of your remaining lab work if it is positive. Complete the entire course of antibiotics regardless of symptom improvement to prevent worsening or recurrence of your infection. Return to ED for worsening symptoms, severe abdominal pain, chest pain, abnormal vaginal bleeding.

## 2018-07-04 LAB — GC/CHLAMYDIA PROBE AMP (~~LOC~~) NOT AT ARMC
Chlamydia: NEGATIVE
NEISSERIA GONORRHEA: NEGATIVE

## 2018-07-11 ENCOUNTER — Emergency Department (HOSPITAL_BASED_OUTPATIENT_CLINIC_OR_DEPARTMENT_OTHER)
Admission: EM | Admit: 2018-07-11 | Discharge: 2018-07-11 | Disposition: A | Discharge status: Home / Self Care | Payer: 59 | Attending: Emergency Medicine | Admitting: Emergency Medicine

## 2018-07-11 ENCOUNTER — Other Ambulatory Visit: Payer: Self-pay

## 2018-07-11 ENCOUNTER — Encounter (HOSPITAL_BASED_OUTPATIENT_CLINIC_OR_DEPARTMENT_OTHER): Payer: Self-pay | Admitting: *Deleted

## 2018-07-11 DIAGNOSIS — Z79899 Other long term (current) drug therapy: Secondary | ICD-10-CM | POA: Insufficient documentation

## 2018-07-11 DIAGNOSIS — T50905A Adverse effect of unspecified drugs, medicaments and biological substances, initial encounter: Secondary | ICD-10-CM | POA: Diagnosis not present

## 2018-07-11 DIAGNOSIS — R002 Palpitations: Secondary | ICD-10-CM | POA: Insufficient documentation

## 2018-07-11 DIAGNOSIS — G4700 Insomnia, unspecified: Secondary | ICD-10-CM | POA: Diagnosis present

## 2018-07-11 MED ORDER — HYDROXYZINE HCL 25 MG PO TABS
50.0000 mg | ORAL_TABLET | Freq: Once | ORAL | Status: AC
  Administered 2018-07-11: 50 mg via ORAL
  Filled 2018-07-11: qty 2

## 2018-07-11 NOTE — Discharge Instructions (Addendum)
You were seen today after an adverse reaction to ecstasy.  You should avoid illicit drug use as you may not know what exactly is in the drug.

## 2018-07-11 NOTE — ED Triage Notes (Signed)
Pt reports taking ecstasy for the first time last night. States she hasn't slept and is feeling weird, c/o palpitations

## 2018-07-11 NOTE — ED Notes (Signed)
Patient is resting comfortably. 

## 2018-07-11 NOTE — ED Notes (Signed)
Anxious, states has been unable to sleep

## 2018-07-11 NOTE — ED Provider Notes (Signed)
[facility] EMERGENCY DEPARTMENT Provider Note   CSN: [id number] Arrival date & time: 07/11/18  0047     History   Chief Complaint Chief Complaint  Patient presents with  . Insomnia    HPI Molly Mcknight is a 32 y.o. female.  HPI  This is a 32 year old female who presents with palpitations and anxiety.  Patient reports that she has been experiencing symptoms since yesterday evening when she tried ecstasy for the first time.  She states that she used it at 7 or 8 PM.  She has not slept and reports palpitations and increasing anxiety.  She denies any other illicit drug use.  She does report drinking alcohol.  She denies any chest pain, shortness of breath, abdominal pain, nausea, vomiting.  She states "I just feel terrible."   Past Medical History:  Diagnosis Date  . Anemia     There are no active problems to display for this patient.   Past Surgical History:  Procedure Laterality Date  . AMPUTATION FINGER Right   . CHOLECYSTECTOMY    . TUBAL LIGATION       OB History    Gravida  3   Para  1   Term  1   Preterm  0   AB  1   Living  1     SAB  1   TAB  0   Ectopic  0   Multiple  0   Live Births  1            Home Medications    Prior to Admission medications   Medication Sig Start Date End Date Taking? Authorizing Provider  Acetaminophen (TYLENOL 8 HOUR PO) Take by mouth.    [provider]  cephALEXin (KEFLEX) 500 MG capsule Take 1 capsule (500 mg total) by mouth 4 (four) times daily. 07/01/18   Khatri, Hina, PA-C  doxycycline (VIBRAMYCIN) 100 MG capsule Take 1 capsule (100 mg total) by mouth 2 (two) times daily. You were given one dose in the ED. 12/15/17   Aviva KluverMurray, Alyssa B, PA-C  metroNIDAZOLE (FLAGYL) 500 MG tablet Take 1 tablet (500 mg total) by mouth 2 (two) times daily. 07/01/18   Khatri, Hina, PA-C  naproxen (NAPROSYN) 500 MG tablet Take 1 tablet (500 mg total) by mouth 2 (two) times daily. 11/12/16   Gwyneth SproutPlunkett,  Whitney, MD  polyethylene glycol powder (GLYCOLAX/MIRALAX) powder Take 17 g by mouth daily. Please take until you are having soft bowel movements daily.  17 g is 1 capful. 12/15/17   Elisha PonderMurray, Alyssa B, PA-C    Family History No family history on file.  Social History Social History   Tobacco Use  . Smoking status: Former Smoker    Packs/day: 0.50  . Smokeless tobacco: Never Used  Substance Use Topics  . Alcohol use: Yes    Comment: occ  . Drug use: No     Allergies   Patient has no known allergies.   Review of Systems Review of Systems  Constitutional: Negative for fever.  Respiratory: Negative for shortness of breath.   Cardiovascular: Positive for palpitations. Negative for chest pain.  Gastrointestinal: Negative for abdominal pain, nausea and vomiting.  Psychiatric/Behavioral: The patient is nervous/anxious.   All other systems reviewed and are negative.    Physical Exam Updated Vital Signs BP 123/87 (BP Location: Left Arm)   Pulse 86   Temp 98.6 F (37 C) (Oral)   Resp 20   Ht 1.549 m (5\' 1" )  Wt 90.7 kg   LMP 06/25/2018   SpO2 100%   Breastfeeding? No   BMI 37.79 kg/m   Physical Exam  Constitutional: She is oriented to person, place, and time. She appears well-developed and well-nourished.  HENT:  Head: Normocephalic and atraumatic.  Eyes: Pupils are equal, round, and reactive to light.  Cardiovascular: Normal rate, regular rhythm and normal heart sounds.  Pulmonary/Chest: Effort normal and breath sounds normal. No respiratory distress. She has no wheezes.  Abdominal: Soft. There is no tenderness.  Neurological: She is alert and oriented to person, place, and time.  Skin: Skin is warm and dry.  Psychiatric: She has a normal mood and affect.  Nursing note and vitals reviewed.    ED Treatments / Results  Labs (all labs ordered are listed, but only abnormal results are displayed) Labs Reviewed - No data to display  EKG None  Radiology No  results found.  Procedures Procedures (including critical care time)  Medications Ordered in ED Medications  hydrOXYzine (ATARAX/VISTARIL) tablet 50 mg (50 mg Oral Given 07/11/18 0116)     Initial Impression / Assessment and Plan / ED Course  I have reviewed the triage vital signs and the nursing notes.  Pertinent labs & imaging results that were available during my care of the patient were reviewed by me and considered in my medical decision making (see chart for details).     Patient presents with anxiety and palpitations.  This is in the setting of using illicit drugs and stimulants greater than 24 hours ago.  She reports that she has not slept.  She is overall nontoxic-appearing and vital signs are reassuring.  She does not particularly appear anxious but states that she feels uncomfortable.  EKG shows no signs of arrhythmia or significant tachycardia.  Patient was given 50 mg of Atarax.  I suspect her symptoms are likely related to a combination of drug and alcohol use as well as sleep deprivation.  This is a known side effect of stimulant drugs.  I discussed this with the patient.  On recheck, she is resting comfortably and states she feels much better.  Do not feel she needs further work-up at this time.  After history, exam, and medical workup I feel the patient has been appropriately medically screened and is safe for discharge home. Pertinent diagnoses were discussed with the patient. Patient was given return precautions.   Final Clinical Impressions(s) / ED Diagnoses   Final diagnoses:  Adverse effect of drug, initial encounter  Palpitations    ED Discharge Orders    None       Horton, Mayer Masker, MD 07/11/18 [phone]

## 2018-11-14 ENCOUNTER — Emergency Department (HOSPITAL_BASED_OUTPATIENT_CLINIC_OR_DEPARTMENT_OTHER)
Admission: EM | Admit: 2018-11-14 | Discharge: 2018-11-14 | Disposition: A | Discharge status: Home / Self Care | Payer: 59 | Attending: Emergency Medicine | Admitting: Emergency Medicine

## 2018-11-14 ENCOUNTER — Other Ambulatory Visit: Payer: Self-pay

## 2018-11-14 ENCOUNTER — Emergency Department (HOSPITAL_BASED_OUTPATIENT_CLINIC_OR_DEPARTMENT_OTHER): Payer: 59

## 2018-11-14 DIAGNOSIS — N898 Other specified noninflammatory disorders of vagina: Secondary | ICD-10-CM | POA: Insufficient documentation

## 2018-11-14 DIAGNOSIS — R102 Pelvic and perineal pain: Secondary | ICD-10-CM | POA: Diagnosis present

## 2018-11-14 DIAGNOSIS — N739 Female pelvic inflammatory disease, unspecified: Secondary | ICD-10-CM | POA: Diagnosis not present

## 2018-11-14 DIAGNOSIS — R300 Dysuria: Secondary | ICD-10-CM | POA: Insufficient documentation

## 2018-11-14 DIAGNOSIS — Z87891 Personal history of nicotine dependence: Secondary | ICD-10-CM | POA: Diagnosis not present

## 2018-11-14 DIAGNOSIS — R112 Nausea with vomiting, unspecified: Secondary | ICD-10-CM | POA: Diagnosis not present

## 2018-11-14 LAB — URINALYSIS, MICROSCOPIC (REFLEX): RBC / HPF: NONE SEEN RBC/hpf (ref 0–5)

## 2018-11-14 LAB — WET PREP, GENITAL
Sperm: NONE SEEN
Trich, Wet Prep: NONE SEEN
YEAST WET PREP: NONE SEEN

## 2018-11-14 LAB — URINALYSIS, ROUTINE W REFLEX MICROSCOPIC
Bilirubin Urine: NEGATIVE
Glucose, UA: NEGATIVE mg/dL
Hgb urine dipstick: NEGATIVE
Ketones, ur: NEGATIVE mg/dL
Nitrite: NEGATIVE
Protein, ur: NEGATIVE mg/dL
pH: 6 (ref 5.0–8.0)

## 2018-11-14 LAB — PREGNANCY, URINE: Preg Test, Ur: NEGATIVE

## 2018-11-14 MED ORDER — ONDANSETRON 4 MG PO TBDP
4.0000 mg | ORAL_TABLET | Freq: Once | ORAL | Status: AC
  Administered 2018-11-14: 4 mg via ORAL
  Filled 2018-11-14: qty 1

## 2018-11-14 MED ORDER — METRONIDAZOLE 500 MG PO TABS
500.0000 mg | ORAL_TABLET | Freq: Once | ORAL | Status: AC
  Administered 2018-11-14: 500 mg via ORAL
  Filled 2018-11-14: qty 1

## 2018-11-14 MED ORDER — DOXYCYCLINE HYCLATE 100 MG PO TABS
100.0000 mg | ORAL_TABLET | Freq: Once | ORAL | Status: AC
  Administered 2018-11-14: 100 mg via ORAL
  Filled 2018-11-14: qty 1

## 2018-11-14 MED ORDER — DOXYCYCLINE HYCLATE 100 MG PO CAPS: 100.0000 mg | ORAL_CAPSULE | Freq: Two times a day (BID) | ORAL | 0 refills | Status: DC

## 2018-11-14 MED ORDER — METRONIDAZOLE 500 MG PO TABS: 500.0000 mg | ORAL_TABLET | Freq: Two times a day (BID) | ORAL | 0 refills | Status: DC

## 2018-11-14 MED ORDER — CEFTRIAXONE SODIUM 250 MG IJ SOLR
250.0000 mg | Freq: Once | INTRAMUSCULAR | Status: AC
  Administered 2018-11-14: 250 mg via INTRAMUSCULAR
  Filled 2018-11-14: qty 250

## 2018-11-14 MED ORDER — KETOROLAC TROMETHAMINE 60 MG/2ML IM SOLN
60.0000 mg | Freq: Once | INTRAMUSCULAR | Status: AC
  Administered 2018-11-14: 60 mg via INTRAMUSCULAR
  Filled 2018-11-14: qty 2

## 2018-11-14 NOTE — ED Notes (Signed)
Pt tolerated po sprite, denies n/v

## 2018-11-14 NOTE — Discharge Instructions (Signed)
You can take Tylenol or Ibuprofen as directed for pain. You can alternate Tylenol and Ibuprofen every 4 hours. If you take Tylenol at 1pm, then you can take Ibuprofen at 5pm. Then you can take Tylenol again at 9pm.   Take antibiotics as directed. Please take all of your antibiotics until finished.  Take Flagyl as directed.  It is very important that you do not consume any alcohol while taking this medication as it will cause you to become violently ill.  Here cultures have been sent for gonorrhea/chlamydia.  They do not come back for 2 or 3 days.  If they are positive, you will be notified.  You have received treatment as he did not need any more treatment.  Do not have any intercourse until you complete treatment.  Your partner will need to be tested also.  As we discussed, closely monitor your symptoms.  Return emergency department any high fever, worsening abdominal pain, right sided abdominal pain, persistent vomiting or any other worsening or concerning symptoms.

## 2018-11-14 NOTE — ED Notes (Signed)
Gave patient sprite for po challenge; gave patient heat pack for lower back pain.

## 2018-11-14 NOTE — ED Triage Notes (Signed)
Pt c/o vaginal discharged and pelvic pain x 3 days

## 2018-11-14 NOTE — ED Provider Notes (Signed)
[facility] EMERGENCY DEPARTMENT Provider Note   CSN: [id number] Arrival date & time: 11/14/18  1835    History   Chief Complaint Chief Complaint  Patient presents with   Vaginal Discharge    HPI Molly Mcknight is a 33 y.o. female with PMH/o anemia presents for evaluation of 3 days of vaginal pain, pelvic pain.  She states she feels a burning sensation in the anterior aspect of her vagina and states that she will sometimes have pain in the suprapubic/pelvic region.  States she has noticed some clear discharge.  She also notes some dysuria but states that has been improving.  He feels like the dysuria is more his discomfort from the vagina.  She has not noticed any overlying rash.  Has not noted any hematuria.  Patient reports she has had some nausea/vomiting and states that she has had decreased appetite.  She does report she has been able to eat but has had some intermittent episodes of nonbloody nonbilious vomiting.  She denies any fever.  She has not taken any medication for symptoms.  Patient states she is currently sexually active with one partner.  They do not use protection.  Patient denies any chest pain, difficulty breathing.     The history is provided by the patient.    Past Medical History:  Diagnosis Date   Anemia     There are no active problems to display for this patient.   Past Surgical History:  Procedure Laterality Date   AMPUTATION FINGER Right    CHOLECYSTECTOMY     TUBAL LIGATION       OB History    Gravida  3   Para  1   Term  1   Preterm  0   AB  1   Living  1     SAB  1   TAB  0   Ectopic  0   Multiple  0   Live Births  1            Home Medications    Prior to Admission medications   Medication Sig Start Date End Date Taking? Authorizing Provider  Acetaminophen (TYLENOL 8 HOUR PO) Take by mouth.    [provider]  doxycycline (VIBRAMYCIN) 100 MG capsule Take 1 capsule (100 mg total) by mouth 2  (two) times daily. 11/14/18   Maxwell Caul, PA-C  metroNIDAZOLE (FLAGYL) 500 MG tablet Take 1 tablet (500 mg total) by mouth 2 (two) times daily. 11/14/18   Maxwell Caul, PA-C  naproxen (NAPROSYN) 500 MG tablet Take 1 tablet (500 mg total) by mouth 2 (two) times daily. 11/12/16   Gwyneth Sprout, MD  polyethylene glycol powder (GLYCOLAX/MIRALAX) powder Take 17 g by mouth daily. Please take until you are having soft bowel movements daily.  17 g is 1 capful. 12/15/17   Elisha Ponder, PA-C    Family History No family history on file.  Social History Social History   Tobacco Use   Smoking status: Former Smoker    Packs/day: 0.50   Smokeless tobacco: Never Used  Substance Use Topics   Alcohol use: Yes    Comment: occ   Drug use: No     Allergies   Patient has no known allergies.   Review of Systems Review of Systems  Constitutional: Negative for fever.  Respiratory: Negative for cough and shortness of breath.   Cardiovascular: Negative for chest pain.  Gastrointestinal: Positive for nausea and vomiting. Negative for  abdominal pain.  Genitourinary: Positive for dysuria, pelvic pain and vaginal discharge. Negative for hematuria.  Skin: Negative for rash.  Neurological: Negative for headaches.  All other systems reviewed and are negative.    Physical Exam Updated Vital Signs BP 105/62 (BP Location: Left Arm)    Pulse (!) 52    Temp 98.2 F (36.8 C) (Oral)    Resp 18    Ht  (1.549 m)    Wt 95.3 kg    LMP 11/10/2018    SpO2 100%    BMI 39.68 kg/m   Physical Exam Vitals signs and nursing note reviewed. Exam conducted with a chaperone present.  Constitutional:      Appearance: Normal appearance. She is well-developed.  HENT:     Head: Normocephalic and atraumatic.  Eyes:     General: Lids are normal.     Conjunctiva/sclera: Conjunctivae normal.     Pupils: Pupils are equal, round, and reactive to light.  Neck:     Musculoskeletal: Full passive range of  motion without pain.  Cardiovascular:     Rate and Rhythm: Normal rate and regular rhythm.     Pulses: Normal pulses.     Heart sounds: Normal heart sounds. No murmur. No friction rub. No gallop.   Pulmonary:     Effort: Pulmonary effort is normal.     Breath sounds: Normal breath sounds.  Abdominal:     Palpations: Abdomen is soft. Abdomen is not rigid.     Tenderness: There is abdominal tenderness in the right lower quadrant, suprapubic area and left lower quadrant. There is no right CVA tenderness, left CVA tenderness or guarding.     Comments: Abdomen is soft, nondistended.  Diffuse tenderness noted to the lower abdomen.  No focal point.  No tenderness noted at McBurney's point.  Genitourinary:    Vagina: Vaginal discharge present.     Cervix: Cervical motion tenderness present.     Uterus: Normal.      Adnexa: Right adnexa normal.       Right: No mass or tenderness.         Left: Tenderness present. No mass.       Comments: The exam was performed with a chaperone present. Normal external female genitalia. No lesions, rash, or sores.  On exam, she had some mild discharge noted in the vaginal vault.  Patient with very mild CMT.  She had mild left adnexal tenderness.  No mass noted.  No right adnexal mass or tenderness.   Musculoskeletal: Normal range of motion.  Skin:    General: Skin is warm and dry.     Capillary Refill: Capillary refill takes less than 2 seconds.  Neurological:     Mental Status: She is alert and oriented to person, place, and time.  Psychiatric:        Speech: Speech normal.      ED Treatments / Results  Labs (all labs ordered are listed, but only abnormal results are displayed) Labs Reviewed  WET PREP, GENITAL - Abnormal; Notable for the following components:      Result Value   Clue Cells Wet Prep HPF POC PRESENT (*)    WBC, Wet Prep HPF POC MODERATE (*)    All other components within normal limits  URINALYSIS, ROUTINE W REFLEX MICROSCOPIC -  Abnormal; Notable for the following components:   APPearance HAZY (*)    Specific Gravity, Urine >1.030 (*)    Leukocytes,Ua TRACE (*)    All other  components within normal limits  URINALYSIS, MICROSCOPIC (REFLEX) - Abnormal; Notable for the following components:   Bacteria, UA MANY (*)    All other components within normal limits  PREGNANCY, URINE  GC/CHLAMYDIA PROBE AMP ([place]    EKG None  Radiology [place] Transvaginal Non-ob  Result Date: 11/14/2018 CLINICAL DATA:  33 year old female with 3 days of pelvic pain. LMP 11/03/2018. EXAM: TRANSABDOMINAL AND TRANSVAGINAL ULTRASOUND OF PELVIS DOPPLER ULTRASOUND OF OVARIES TECHNIQUE: Both transabdominal and transvaginal ultrasound examinations of the pelvis were performed. Transabdominal technique was performed for global imaging of the pelvis including uterus, ovaries, adnexal regions, and pelvic cul-de-sac. It was necessary to proceed with endovaginal exam following the transabdominal exam to visualize the ovaries. Color and duplex Doppler ultrasound was utilized to evaluate blood flow to the ovaries. COMPARISON:  CT Abdomen and Pelvis 12/31/2014. FINDINGS: Uterus Measurements: 8.9 x 4.7 x 5.3 centimeters = volume: 113 mL. No fibroids or other mass visualized. Endometrium Thickness: 10 millimeters.  No focal abnormality visualized. Right ovary Measurements: 3.8 x 2.7 x 3.4 centimeters = volume: 19 mL. Multiple small follicles. Normal appearance/no adnexal mass. Left ovary Measurements: 3.1 x 1.7 x 2.2 centimeters = volume: 6 mL. Occasional small follicles (image 46). Normal appearance/no adnexal mass. Pulsed Doppler evaluation of both ovaries demonstrates normal low-resistance arterial and venous waveforms. Other findings No pelvic free fluid. IMPRESSION: Normal study.  Negative for ovarian torsion. Electronically Signed   By: Odessa FlemingH  Hall M.D.   On: 11/14/2018 22:24   [place] Pelvis Complete  Result Date: 11/14/2018 CLINICAL DATA:   33 year old female with 3 days of pelvic pain. LMP 11/03/2018. EXAM: TRANSABDOMINAL AND TRANSVAGINAL ULTRASOUND OF PELVIS DOPPLER ULTRASOUND OF OVARIES TECHNIQUE: Both transabdominal and transvaginal ultrasound examinations of the pelvis were performed. Transabdominal technique was performed for global imaging of the pelvis including uterus, ovaries, adnexal regions, and pelvic cul-de-sac. It was necessary to proceed with endovaginal exam following the transabdominal exam to visualize the ovaries. Color and duplex Doppler ultrasound was utilized to evaluate blood flow to the ovaries. COMPARISON:  CT Abdomen and Pelvis 12/31/2014. FINDINGS: Uterus Measurements: 8.9 x 4.7 x 5.3 centimeters = volume: 113 mL. No fibroids or other mass visualized. Endometrium Thickness: 10 millimeters.  No focal abnormality visualized. Right ovary Measurements: 3.8 x 2.7 x 3.4 centimeters = volume: 19 mL. Multiple small follicles. Normal appearance/no adnexal mass. Left ovary Measurements: 3.1 x 1.7 x 2.2 centimeters = volume: 6 mL. Occasional small follicles (image 46). Normal appearance/no adnexal mass. Pulsed Doppler evaluation of both ovaries demonstrates normal low-resistance arterial and venous waveforms. Other findings No pelvic free fluid. IMPRESSION: Normal study.  Negative for ovarian torsion. Electronically Signed   By: Odessa FlemingH  Hall M.D.   On: 11/14/2018 22:24   [place] Art/ven Flow Abd Pelv Doppler  Result Date: 11/14/2018 CLINICAL DATA:  33 year old female with 3 days of pelvic pain. LMP 11/03/2018. EXAM: TRANSABDOMINAL AND TRANSVAGINAL ULTRASOUND OF PELVIS DOPPLER ULTRASOUND OF OVARIES TECHNIQUE: Both transabdominal and transvaginal ultrasound examinations of the pelvis were performed. Transabdominal technique was performed for global imaging of the pelvis including uterus, ovaries, adnexal regions, and pelvic cul-de-sac. It was necessary to proceed with endovaginal exam following the transabdominal exam to visualize the ovaries.  Color and duplex Doppler ultrasound was utilized to evaluate blood flow to the ovaries. COMPARISON:  CT Abdomen and Pelvis 12/31/2014. FINDINGS: Uterus Measurements: 8.9 x 4.7 x 5.3 centimeters = volume: 113 mL. No fibroids or other mass visualized. Endometrium Thickness: 10 millimeters.  No focal  abnormality visualized. Right ovary Measurements: 3.8 x 2.7 x 3.4 centimeters = volume: 19 mL. Multiple small follicles. Normal appearance/no adnexal mass. Left ovary Measurements: 3.1 x 1.7 x 2.2 centimeters = volume: 6 mL. Occasional small follicles (image 46). Normal appearance/no adnexal mass. Pulsed Doppler evaluation of both ovaries demonstrates normal low-resistance arterial and venous waveforms. Other findings No pelvic free fluid. IMPRESSION: Normal study.  Negative for ovarian torsion. Electronically Signed   By: Odessa Fleming M.D.   On: 11/14/2018 22:24    Procedures Procedures (including critical care time)  Medications Ordered in ED Medications  ondansetron (ZOFRAN-ODT) disintegrating tablet 4 mg (4 mg Oral Given 11/14/18 1914)  ketorolac (TORADOL) injection 60 mg (60 mg Intramuscular Given 11/14/18 1958)  cefTRIAXone (ROCEPHIN) injection 250 mg (250 mg Intramuscular Given 11/14/18 2243)  metroNIDAZOLE (FLAGYL) tablet 500 mg (500 mg Oral Given 11/14/18 2244)  doxycycline (VIBRA-TABS) tablet 100 mg (100 mg Oral Given 11/14/18 2244)     Initial Impression / Assessment and Plan / ED Course  I have reviewed the triage vital signs and the nursing notes.  Pertinent labs & imaging results that were available during my care of the patient were reviewed by me and considered in my medical decision making (see chart for details).        33 year old female who presents for evaluation of 3 days of vaginal pain, pelvic pain.  She has not noticed much discharge but states she feels like there is pain inside the vagina.  Patient currently sexually active with one partner.  They do not use protection. Patient is  afebrile, non-toxic appearing, sitting comfortably on examination table. Vital signs reviewed and stable.  On exam, patient with no CVA tenderness.  She has diffuse tenderness to the lower abdomen, mostly at the suprapubic region.  Tenderness does extend the right lower quadrant but no focal point tenderness.  No tenderness noted at McBurney's point.  History/physical exam not concerning for tubo-ovarian abscess, appendicitis, ovarian torsion.  Question UTI versus STD versus PID.  We will plan to check UA, pelvic.  Pelvic exam as documented above.  There was a small amount of discharge noted.  Patient did have some very mild CMT as well as some left adnexal tenderness.  Given CMT and left adnexal tenderness, will plan for ultrasound for evaluation of any ovarian etiology.  UA with trace leukocytes.  There is bacteria but there is squamous epithelium so question contaminant.  Urine pregnancy negative.  Wet prep shows positive clue cells.  [place] reviewed.  No evidence of acute intra-abdominal abnormality.  Discussed results with patient.  Patient is been able to tolerate p.o. here in the ED.  Vital signs are stable.  Repeat abdominal exam improved.  Patient reports improvement in pain after analgesics provided here in ED.  Exam not concerning for appendicitis.  Abdominal exam not concerning for surgical abdomen.  No indication for CT on pelvis.  We will plan to treat as PID.  Patient with no known drug allergies.  Encourage patient on safe sex practices. At this time, patient exhibits no emergent life-threatening condition that require further evaluation in ED or admission. Patient had ample opportunity for questions and discussion. All patient's questions were answered with full understanding. Strict return precautions discussed. Patient expresses understanding and agreement to plan.   Portions of this note were generated with Scientist, clinical (histocompatibility and immunogenetics). Dictation errors may occur despite best attempts at  proofreading.   Final Clinical Impressions(s) / ED Diagnoses   Final diagnoses:  Pelvic  inflammatory disease    ED Discharge Orders         Ordered    doxycycline (VIBRAMYCIN) 100 MG capsule  2 times daily     11/14/18 2233    metroNIDAZOLE (FLAGYL) 500 MG tablet  2 times daily     11/14/18 2233           Rosana Hoes 11/14/18 2251    Charlynne Pander, MD 11/14/18 [phone]

## 2018-11-15 LAB — GC/CHLAMYDIA PROBE AMP (~~LOC~~) NOT AT ARMC
Chlamydia: NEGATIVE
Neisseria Gonorrhea: NEGATIVE

## 2018-11-20 ENCOUNTER — Encounter (HOSPITAL_BASED_OUTPATIENT_CLINIC_OR_DEPARTMENT_OTHER): Payer: Self-pay | Admitting: Adult Health

## 2018-11-20 ENCOUNTER — Emergency Department (HOSPITAL_BASED_OUTPATIENT_CLINIC_OR_DEPARTMENT_OTHER)
Admission: EM | Admit: 2018-11-20 | Discharge: 2018-11-20 | Disposition: A | Discharge status: Home / Self Care | Payer: 59 | Attending: Emergency Medicine | Admitting: Emergency Medicine

## 2018-11-20 ENCOUNTER — Other Ambulatory Visit: Payer: Self-pay

## 2018-11-20 DIAGNOSIS — Z87891 Personal history of nicotine dependence: Secondary | ICD-10-CM | POA: Diagnosis not present

## 2018-11-20 DIAGNOSIS — Z79899 Other long term (current) drug therapy: Secondary | ICD-10-CM | POA: Insufficient documentation

## 2018-11-20 DIAGNOSIS — N898 Other specified noninflammatory disorders of vagina: Secondary | ICD-10-CM | POA: Diagnosis present

## 2018-11-20 DIAGNOSIS — N390 Urinary tract infection, site not specified: Secondary | ICD-10-CM | POA: Diagnosis not present

## 2018-11-20 LAB — URINALYSIS, ROUTINE W REFLEX MICROSCOPIC
Bilirubin Urine: NEGATIVE
Glucose, UA: NEGATIVE mg/dL
Hgb urine dipstick: NEGATIVE
Ketones, ur: NEGATIVE mg/dL
Nitrite: NEGATIVE
Protein, ur: NEGATIVE mg/dL
Specific Gravity, Urine: 1.025 (ref 1.005–1.030)
pH: 5.5 (ref 5.0–8.0)

## 2018-11-20 LAB — URINALYSIS, MICROSCOPIC (REFLEX)

## 2018-11-20 LAB — PREGNANCY, URINE: Preg Test, Ur: NEGATIVE

## 2018-11-20 MED ORDER — CEPHALEXIN 500 MG PO CAPS: 500.0000 mg | ORAL_CAPSULE | Freq: Two times a day (BID) | ORAL | 0 refills | Status: AC

## 2018-11-20 MED ORDER — PHENAZOPYRIDINE HCL 200 MG PO TABS: 200.0000 mg | ORAL_TABLET | Freq: Three times a day (TID) | ORAL | 0 refills | Status: DC

## 2018-11-20 NOTE — Discharge Instructions (Addendum)
Take the antibiotics as prescribed in addition to the 2 medications you were given last week. Take the Pyridium as needed for discomfort. We will contact you if you need to be on a different antibiotic based on your urine culture results. Return to the ED if you start to have worsening symptoms, develop a fever, back pain, abdominal pain.

## 2018-11-20 NOTE — ED Triage Notes (Signed)
Presents with vaginal pain and discharge since Thursday. SHe was seen here and treated fos PID on 3/23. Discharge is described as thick and white. She has been sexually since she was here last. Partner has not been treated.

## 2018-11-20 NOTE — ED Provider Notes (Signed)
[facility] EMERGENCY DEPARTMENT Provider Note   CSN: [id number] Arrival date & time: 11/20/18  1209    History   Chief Complaint Chief Complaint  Patient presents with  . Vaginal Pain    HPI Molly Mcknight is a 33 y.o. female who presents to ED for evaluation of ongoing vaginal discomfort, irritation, dysuria for the past week.  She was seen and evaluated here on 11/14/2018, diagnosed with PID, BV and discharged home with doxycycline, Flagyl.  She has been taking these medications as prescribed.  She continues to be sexually active with protection.  States that the amount of discharge has worsened but remains thick and white.  Reports external irritation when she wipes, noting "it might be because I'm wiping too hard."  She denies any abdominal pain, back pain, fevers or hematuria.     HPI  Past Medical History:  Diagnosis Date  . Anemia     There are no active problems to display for this patient.   Past Surgical History:  Procedure Laterality Date  . AMPUTATION FINGER Right   . CHOLECYSTECTOMY    . TUBAL LIGATION       OB History    Gravida  3   Para  1   Term  1   Preterm  0   AB  1   Living  1     SAB  1   TAB  0   Ectopic  0   Multiple  0   Live Births  1            Home Medications    Prior to Admission medications   Medication Sig Start Date End Date Taking? Authorizing Provider  Acetaminophen (TYLENOL 8 HOUR PO) Take by mouth.    [provider]  cephALEXin (KEFLEX) 500 MG capsule Take 1 capsule (500 mg total) by mouth 2 (two) times daily for 5 days. 11/20/18 11/25/18  Samanth Mirkin, PA-C  doxycycline (VIBRAMYCIN) 100 MG capsule Take 1 capsule (100 mg total) by mouth 2 (two) times daily. 11/14/18   Maxwell Caul, PA-C  metroNIDAZOLE (FLAGYL) 500 MG tablet Take 1 tablet (500 mg total) by mouth 2 (two) times daily. 11/14/18   Maxwell Caul, PA-C  naproxen (NAPROSYN) 500 MG tablet Take 1 tablet (500 mg total) by  mouth 2 (two) times daily. 11/12/16   Gwyneth Sprout, MD  phenazopyridine (PYRIDIUM) 200 MG tablet Take 1 tablet (200 mg total) by mouth 3 (three) times daily. 11/20/18   Marquetta Weiskopf, PA-C  polyethylene glycol powder (GLYCOLAX/MIRALAX) powder Take 17 g by mouth daily. Please take until you are having soft bowel movements daily.  17 g is 1 capful. 12/15/17   Elisha Ponder, PA-C    Family History History reviewed. No pertinent family history.  Social History Social History   Tobacco Use  . Smoking status: Former Smoker    Packs/day: 0.50  . Smokeless tobacco: Never Used  Substance Use Topics  . Alcohol use: Yes    Comment: occ  . Drug use: No     Allergies   Patient has no known allergies.   Review of Systems Review of Systems  Constitutional: Negative for appetite change, chills and fever.  HENT: Negative for ear pain, rhinorrhea, sneezing and sore throat.   Eyes: Negative for photophobia and visual disturbance.  Respiratory: Negative for cough, chest tightness, shortness of breath and wheezing.   Cardiovascular: Negative for chest pain and palpitations.  Gastrointestinal: Negative for abdominal  pain, blood in stool, constipation, diarrhea, nausea and vomiting.  Genitourinary: Positive for dysuria, pelvic pain and vaginal discharge. Negative for hematuria and urgency.  Musculoskeletal: Negative for myalgias.  Skin: Negative for rash.  Neurological: Negative for dizziness, weakness and light-headedness.     Physical Exam Updated Vital Signs BP 101/67 (BP Location: Right Arm)   Pulse (!) 59   Temp 98.5 F (36.9 C) (Oral)   Resp 18   Ht 5\' 1"  (1.549 m)   Wt 95.2 kg   LMP 11/10/2018   SpO2 99%   BMI 39.66 kg/m   Physical Exam Vitals signs and nursing note reviewed.  Constitutional:      General: She is not in acute distress.    Appearance: She is well-developed.  HENT:     Head: Normocephalic and atraumatic.     Nose: Nose normal.  Eyes:     General: No  scleral icterus.       Left eye: No discharge.     Conjunctiva/sclera: Conjunctivae normal.  Neck:     Musculoskeletal: Normal range of motion and neck supple.  Cardiovascular:     Rate and Rhythm: Normal rate and regular rhythm.     Heart sounds: Normal heart sounds. No murmur. No friction rub. No gallop.   Pulmonary:     Effort: Pulmonary effort is normal. No respiratory distress.     Breath sounds: Normal breath sounds.  Abdominal:     General: Bowel sounds are normal. There is no distension.     Palpations: Abdomen is soft.     Tenderness: There is no abdominal tenderness. There is no right CVA tenderness, left CVA tenderness or guarding.  Musculoskeletal: Normal range of motion.  Skin:    General: Skin is warm and dry.     Findings: No rash.  Neurological:     Mental Status: She is alert.     Motor: No abnormal muscle tone.     Coordination: Coordination normal.      ED Treatments / Results  Labs (all labs ordered are listed, but only abnormal results are displayed) Labs Reviewed  URINALYSIS, ROUTINE W REFLEX MICROSCOPIC - Abnormal; Notable for the following components:      Result Value   APPearance HAZY (*)    Leukocytes,Ua MODERATE (*)    All other components within normal limits  URINALYSIS, MICROSCOPIC (REFLEX) - Abnormal; Notable for the following components:   Bacteria, UA MANY (*)    All other components within normal limits  URINE CULTURE  PREGNANCY, URINE    EKG None  Radiology No results found.  Procedures Procedures (including critical care time)  Medications Ordered in ED Medications - No data to display   Initial Impression / Assessment and Plan / ED Course  I have reviewed the triage vital signs and the nursing notes.  Pertinent labs & imaging results that were available during my care of the patient were reviewed by me and considered in my medical decision making (see chart for details).        33 year old female presents to ED for  ongoing dysuria, vaginal discharge and irritation for the past week.  Seen and evaluated on 323 with negative pelvic ultrasound, wet prep showing clue cells and WBCs, negative GC chlamydia, equivocal UA.  She was given doxycycline and Flagyl which she has been taking.  She continues to be sexually active while undergoing treatment.  States that the amount of discharge has worsened but characteristics remain the same.  Denies any  abdominal pain.  On my exam she is overall well-appearing.  Abdomen is soft, nontender nondistended.  Vital signs are within normal limits.  I do not see an indication for repeat pelvic exam at this time as she had extensive work-up done less than 1 week ago.  Will reobtain UA as her previous UA from last week shows bacteria and leukocytes but poor sample. She has no CVA tenderness or fever that would concern me for pyelonephritis.  Urinalysis still showing many bacteria, squamous cells but now moderate leukocytes.  Based on her symptoms, feel that we can treat for UTI and sent for culture for definitive treatment.  Will give Azo for discomfort.  Advised to follow-up with OB/GYN, PCP and return to ED for any severe worsening symptoms.  Patient is hemodynamically stable, in NAD, and able to ambulate in the ED. Evaluation does not show pathology that would require ongoing emergent intervention or inpatient treatment. I explained the diagnosis to the patient. Pain has been managed and has no complaints prior to discharge. Patient is comfortable with above plan and is stable for discharge at this time. All questions were answered prior to disposition. Strict return precautions for returning to the ED were discussed. Encouraged follow up with PCP.    Portions of this note were generated with Scientist, clinical (histocompatibility and immunogenetics). Dictation errors may occur despite best attempts at proofreading.  Final Clinical Impressions(s) / ED Diagnoses   Final diagnoses:  Lower urinary tract infectious  disease    ED Discharge Orders         Ordered    cephALEXin (KEFLEX) 500 MG capsule  2 times daily     11/20/18 1334    phenazopyridine (PYRIDIUM) 200 MG tablet  3 times daily     11/20/18 1334           Dietrich Pates, PA-C 11/20/18 1336    Alvira Monday, MD 11/20/18 [phone]

## 2018-11-21 LAB — URINE CULTURE

## 2019-01-03 ENCOUNTER — Emergency Department (HOSPITAL_BASED_OUTPATIENT_CLINIC_OR_DEPARTMENT_OTHER)
Admission: EM | Admit: 2019-01-03 | Discharge: 2019-01-03 | Disposition: A | Discharge status: Home / Self Care | Payer: 59 | Attending: Emergency Medicine | Admitting: Emergency Medicine

## 2019-01-03 ENCOUNTER — Other Ambulatory Visit: Payer: Self-pay

## 2019-01-03 ENCOUNTER — Encounter (HOSPITAL_BASED_OUTPATIENT_CLINIC_OR_DEPARTMENT_OTHER): Payer: Self-pay | Admitting: *Deleted

## 2019-01-03 DIAGNOSIS — N760 Acute vaginitis: Secondary | ICD-10-CM | POA: Diagnosis not present

## 2019-01-03 DIAGNOSIS — Z79899 Other long term (current) drug therapy: Secondary | ICD-10-CM | POA: Insufficient documentation

## 2019-01-03 DIAGNOSIS — R102 Pelvic and perineal pain: Secondary | ICD-10-CM

## 2019-01-03 DIAGNOSIS — Z87891 Personal history of nicotine dependence: Secondary | ICD-10-CM | POA: Insufficient documentation

## 2019-01-03 DIAGNOSIS — N898 Other specified noninflammatory disorders of vagina: Secondary | ICD-10-CM | POA: Diagnosis present

## 2019-01-03 DIAGNOSIS — B9689 Other specified bacterial agents as the cause of diseases classified elsewhere: Secondary | ICD-10-CM

## 2019-01-03 HISTORY — DX: Trichomoniasis, unspecified: A59.9

## 2019-01-03 HISTORY — DX: Chlamydial infection, unspecified: A74.9

## 2019-01-03 HISTORY — DX: Female pelvic inflammatory disease, unspecified: N73.9

## 2019-01-03 LAB — WET PREP, GENITAL
Sperm: NONE SEEN
Trich, Wet Prep: NONE SEEN
Yeast Wet Prep HPF POC: NONE SEEN

## 2019-01-03 LAB — URINALYSIS, ROUTINE W REFLEX MICROSCOPIC
Bilirubin Urine: NEGATIVE
Glucose, UA: NEGATIVE mg/dL
Hgb urine dipstick: NEGATIVE
Ketones, ur: NEGATIVE mg/dL
Leukocytes,Ua: NEGATIVE
Nitrite: NEGATIVE
Protein, ur: NEGATIVE mg/dL
Specific Gravity, Urine: 1.025 (ref 1.005–1.030)
pH: 6 (ref 5.0–8.0)

## 2019-01-03 LAB — PREGNANCY, URINE: Preg Test, Ur: NEGATIVE

## 2019-01-03 MED ORDER — METRONIDAZOLE 0.75 % VA GEL: 1.0000 | Freq: Two times a day (BID) | VAGINAL | 0 refills | Status: DC

## 2019-01-03 MED ORDER — METRONIDAZOLE 500 MG PO TABS: 500.0000 mg | ORAL_TABLET | Freq: Two times a day (BID) | ORAL | 0 refills | Status: DC

## 2019-01-03 MED FILL — metroNIDAZOLE 500 MG TABS: 500 | 7 days supply | Qty: 14 | Fill #0

## 2019-01-03 NOTE — ED Triage Notes (Signed)
Pt c/o vaginal discharge x 1 week.  

## 2019-01-03 NOTE — ED Notes (Signed)
ED Provider at bedside. 

## 2019-01-03 NOTE — ED Notes (Signed)
Pt verbalizes understanding of d/c instructions and denies any further needs at this time. 

## 2019-01-03 NOTE — ED Provider Notes (Signed)
[facility] EMERGENCY DEPARTMENT Provider Note   CSN: [id number] Arrival date & time: 01/03/19  1622    History   Chief Complaint Chief Complaint  Patient presents with  . Vaginal Discharge    HPI Molly Mcknight is a 33 y.o. female.     Patient is a 33 year old female with a history of chlamydia, trichomonas, PID status post tubal ligation who is presenting today with persistent vaginal discharge, odor and left pelvic tenderness.  Patient was seen on 11/14/2018 and diagnosed with PID, UTI and BV.  At that time she had a pelvic ultrasound that showed no acute findings.  Patient did complete her course of Flagyl and doxycycline and was seen on 11/21/2018 for ongoing vaginal discharge.  Patient states that the discharge waxes and wanes and always seems to be worse after her menses.  She finished her last menstrual period approximately 1 week ago and states the discharge has worsened and has a foul smell.  She is also having some dysuria.  She denies any fever, back pain but has also had pain in the left pelvic area.  She states the pain has been there for months but seems to be worse after her menses.  She is currently sexually active and last intercourse was 2 weeks ago.  She has been with this partner for 5 months and does not use protection regularly.  She is not currently have an OB/GYN and has not followed up with OB/GYN.  The history is provided by the patient.  Vaginal Discharge  Quality:  White Severity:  Moderate Onset quality:  Gradual Timing:  Intermittent Progression:  Waxing and waning Chronicity:  Chronic Context comment:  Always seems to be worse after menses Relieved by:  Nothing Worsened by:  Nothing Ineffective treatments: AZO tabs. Associated symptoms: abdominal pain, dysuria and vaginal itching   Associated symptoms: no fever, no genital lesions, no nausea, no urinary frequency, no urinary hesitancy, no urinary incontinence and no vomiting   Risk factors:  unprotected sex     Past Medical History:  Diagnosis Date  . Anemia   . Chlamydia   . PID (pelvic inflammatory disease)   . Trichimoniasis     There are no active problems to display for this patient.   Past Surgical History:  Procedure Laterality Date  . AMPUTATION FINGER Right   . CHOLECYSTECTOMY    . TUBAL LIGATION       OB History    Gravida  3   Para  1   Term  1   Preterm  0   AB  1   Living  1     SAB  1   TAB  0   Ectopic  0   Multiple  0   Live Births  1            Home Medications    Prior to Admission medications   Medication Sig Start Date End Date Taking? Authorizing Provider  Acetaminophen (TYLENOL 8 HOUR PO) Take by mouth.    [provider]  doxycycline (VIBRAMYCIN) 100 MG capsule Take 1 capsule (100 mg total) by mouth 2 (two) times daily. 11/14/18   Maxwell Caul, PA-C  naproxen (NAPROSYN) 500 MG tablet Take 1 tablet (500 mg total) by mouth 2 (two) times daily. 11/12/16   Gwyneth Sprout, MD  polyethylene glycol powder (GLYCOLAX/MIRALAX) powder Take 17 g by mouth daily. Please take until you are having soft bowel movements daily.  17 g is  1 capful. 12/15/17   Elisha PonderMurray, Alyssa B, PA-C    Family History History reviewed. No pertinent family history.  Social History Social History   Tobacco Use  . Smoking status: Former Smoker    Packs/day: 0.50  . Smokeless tobacco: Never Used  Substance Use Topics  . Alcohol use: Yes    Comment: occ  . Drug use: No     Allergies   Patient has no known allergies.   Review of Systems Review of Systems  Constitutional: Negative for fever.  Gastrointestinal: Positive for abdominal pain. Negative for nausea and vomiting.  Genitourinary: Positive for dysuria and vaginal discharge. Negative for bladder incontinence and hesitancy.  All other systems reviewed and are negative.    Physical Exam Updated Vital Signs BP 113/85   Pulse 68   Temp 98.2 F (36.8 C)   Resp 18    Ht 5\' 1"  (1.549 m)   Wt 90.7 kg   LMP 12/28/2018   SpO2 100%   BMI 37.79 kg/m   Physical Exam Vitals signs and nursing note reviewed.  Constitutional:      General: She is not in acute distress.    Appearance: She is well-developed.  HENT:     Head: Normocephalic and atraumatic.  Eyes:     Pupils: Pupils are equal, round, and reactive to light.  Cardiovascular:     Rate and Rhythm: Normal rate and regular rhythm.     Heart sounds: Normal heart sounds. No murmur. No friction rub.  Pulmonary:     Effort: Pulmonary effort is normal.     Breath sounds: Normal breath sounds. No wheezing or rales.  Abdominal:     General: Bowel sounds are normal. There is no distension.     Palpations: Abdomen is soft.     Tenderness: There is abdominal tenderness. There is no right CVA tenderness, left CVA tenderness, guarding or rebound.    Genitourinary:    Pubic Area: No rash.      Labia:        Right: No tenderness or lesion.        Left: No tenderness or lesion.      Vagina: No vaginal discharge or tenderness.     Cervix: No cervical motion tenderness, discharge or cervical bleeding.     Uterus: Normal.      Adnexa: Right adnexa normal.       Left: Tenderness present. No fullness.    Musculoskeletal: Normal range of motion.        General: No tenderness.     Comments: No edema  Skin:    General: Skin is warm and dry.     Findings: No rash.  Neurological:     Mental Status: She is alert and oriented to person, place, and time.     Cranial Nerves: No cranial nerve deficit.  Psychiatric:        Mood and Affect: Mood normal.        Behavior: Behavior normal.        Thought Content: Thought content normal.      ED Treatments / Results  Labs (all labs ordered are listed, but only abnormal results are displayed) Labs Reviewed  WET PREP, GENITAL - Abnormal; Notable for the following components:      Result Value   Clue Cells Wet Prep HPF POC PRESENT (*)    WBC, Wet Prep HPF POC  MODERATE (*)    All other components within normal limits  URINALYSIS, ROUTINE W  REFLEX MICROSCOPIC - Abnormal; Notable for the following components:   APPearance CLOUDY (*)    All other components within normal limits  PREGNANCY, URINE  [facility]    EKG None  Radiology No results found.  Procedures Procedures (including critical care time)  Medications Ordered in ED Medications - No data to display   Initial Impression / Assessment and Plan / ED Course  I have reviewed the triage vital signs and the nursing notes.  Pertinent labs & imaging results that were available during my care of the patient were reviewed by me and considered in my medical decision making (see chart for details).        Patient is a 33 year old female presenting with recurrent vaginal discharge and left pelvic pain.  Patient seen approximately 1.5 months ago for similar symptoms.  At that time she was diagnosed with PID and BV.  She was given doxycycline and Flagyl but GC, chlamydia, trichomonas was all negative.  Patient has an ultrasound at that time which showed no acute findings.  She states since that time for the last several months she has had mild left pelvic pain but it is always worse after her menses as well as the discharge.  On exam she has minimal to no discharge and has a left adnexal tenderness.  Low suspicion that this is ovarian torsion and symptoms have been going on for some time and not acute in nature.  Patient has had an ultrasound within the last month and a half and it showed no significant ovarian cyst or mass.  Will repeat GC chlamydia, UA, wet prep and UPT.  Patient is otherwise well-appearing on exam.  Vital signs are reassuring.  Feel it is important that patient follow-up with OB/GYN as she may have some other cause for her symptoms that needs further evaluation by a specialist but today she is displaying no signs of an emergent process.   5:19 PM UA wnl.  Wet prep with clue cells and mod wbc.  Neg preg.  Pt treated for bv with flagyl.  Given women's clinic f/u. Final Clinical Impressions(s) / ED Diagnoses   Final diagnoses:  BV (bacterial vaginosis)  Left adnexal tenderness    ED Discharge Orders         Ordered    metroNIDAZOLE (FLAGYL) 500 MG tablet  2 times daily     01/03/19 1717    metroNIDAZOLE (METROGEL VAGINAL) 0.75 % vaginal gel  2 times daily     01/03/19 1718           Gwyneth Sprout, MD 01/03/19 1720

## 2019-01-04 LAB — GC/CHLAMYDIA PROBE AMP (~~LOC~~) NOT AT ARMC
Chlamydia: NEGATIVE
Neisseria Gonorrhea: NEGATIVE

## 2019-03-14 ENCOUNTER — Emergency Department (HOSPITAL_BASED_OUTPATIENT_CLINIC_OR_DEPARTMENT_OTHER)
Admission: EM | Admit: 2019-03-14 | Discharge: 2019-03-14 | Disposition: A | Discharge status: Home / Self Care | Payer: 59 | Attending: Emergency Medicine | Admitting: Emergency Medicine

## 2019-03-14 ENCOUNTER — Encounter (HOSPITAL_BASED_OUTPATIENT_CLINIC_OR_DEPARTMENT_OTHER): Payer: Self-pay | Admitting: *Deleted

## 2019-03-14 ENCOUNTER — Other Ambulatory Visit: Payer: Self-pay

## 2019-03-14 DIAGNOSIS — N760 Acute vaginitis: Secondary | ICD-10-CM

## 2019-03-14 DIAGNOSIS — N739 Female pelvic inflammatory disease, unspecified: Secondary | ICD-10-CM | POA: Diagnosis not present

## 2019-03-14 DIAGNOSIS — R109 Unspecified abdominal pain: Secondary | ICD-10-CM | POA: Insufficient documentation

## 2019-03-14 DIAGNOSIS — R300 Dysuria: Secondary | ICD-10-CM | POA: Diagnosis not present

## 2019-03-14 DIAGNOSIS — B9689 Other specified bacterial agents as the cause of diseases classified elsewhere: Secondary | ICD-10-CM | POA: Diagnosis not present

## 2019-03-14 DIAGNOSIS — Z87891 Personal history of nicotine dependence: Secondary | ICD-10-CM | POA: Insufficient documentation

## 2019-03-14 DIAGNOSIS — N898 Other specified noninflammatory disorders of vagina: Secondary | ICD-10-CM

## 2019-03-14 DIAGNOSIS — N12 Tubulo-interstitial nephritis, not specified as acute or chronic: Secondary | ICD-10-CM | POA: Diagnosis not present

## 2019-03-14 LAB — URINALYSIS, ROUTINE W REFLEX MICROSCOPIC
Bilirubin Urine: NEGATIVE
Glucose, UA: NEGATIVE mg/dL
Hgb urine dipstick: NEGATIVE
Ketones, ur: NEGATIVE mg/dL
Nitrite: NEGATIVE
Protein, ur: NEGATIVE mg/dL
Specific Gravity, Urine: >1.03 — ABNORMAL HIGH (ref 1.005–1.030)
pH: 6 (ref 5.0–8.0)

## 2019-03-14 LAB — LIPASE, BLOOD: Lipase: 24 U/L (ref 11–51)

## 2019-03-14 LAB — PREGNANCY, URINE: Preg Test, Ur: NEGATIVE

## 2019-03-14 LAB — CBC WITH DIFFERENTIAL/PLATELET
Abs Immature Granulocytes: 0.01 10*3/uL (ref 0.00–0.07)
Basophils Absolute: 0 10*3/uL (ref 0.0–0.1)
Basophils Relative: 1 %
Eosinophils Absolute: 0.1 10*3/uL (ref 0.0–0.5)
Eosinophils Relative: 1 %
HCT: 40.5 % (ref 36.0–46.0)
Hemoglobin: 13.3 g/dL (ref 12.0–15.0)
Immature Granulocytes: 0 %
Lymphocytes Relative: 25 %
Lymphs Abs: 1.6 10*3/uL (ref 0.7–4.0)
MCH: 30.2 pg (ref 26.0–34.0)
MCHC: 32.8 g/dL (ref 30.0–36.0)
MCV: 91.8 fL (ref 80.0–100.0)
Monocytes Absolute: 0.5 10*3/uL (ref 0.1–1.0)
Monocytes Relative: 8 %
Neutro Abs: 4.1 10*3/uL (ref 1.7–7.7)
Neutrophils Relative %: 65 %
Platelets: 184 10*3/uL (ref 150–400)
RBC: 4.41 MIL/uL (ref 3.87–5.11)
RDW: 12.1 % (ref 11.5–15.5)
WBC: 6.3 10*3/uL (ref 4.0–10.5)
nRBC: 0 % (ref 0.0–0.2)

## 2019-03-14 LAB — COMPREHENSIVE METABOLIC PANEL
ALT: 19 U/L (ref 0–44)
AST: 17 U/L (ref 15–41)
Albumin: 4.1 g/dL (ref 3.5–5.0)
Alkaline Phosphatase: 50 U/L (ref 38–126)
Anion gap: 9 (ref 5–15)
BUN: 10 mg/dL (ref 6–20)
CO2: 24 mmol/L (ref 22–32)
Calcium: 9 mg/dL (ref 8.9–10.3)
Chloride: 108 mmol/L (ref 98–111)
Creatinine, Ser: 0.68 mg/dL (ref 0.44–1.00)
GFR calc Af Amer: >60 mL/min (ref >60)
GFR calc non Af Amer: >60 mL/min (ref >60)
Glucose, Bld: 106 mg/dL — ABNORMAL HIGH (ref 70–99)
Potassium: 3.3 mmol/L — ABNORMAL LOW (ref 3.5–5.1)
Sodium: 141 mmol/L (ref 135–145)
Total Bilirubin: 0.6 mg/dL (ref 0.3–1.2)
Total Protein: 6.9 g/dL (ref 6.5–8.1)

## 2019-03-14 LAB — URINALYSIS, MICROSCOPIC (REFLEX)

## 2019-03-14 LAB — WET PREP, GENITAL
Sperm: NONE SEEN
Trich, Wet Prep: NONE SEEN
Yeast Wet Prep HPF POC: NONE SEEN

## 2019-03-14 MED ORDER — DOXYCYCLINE HYCLATE 100 MG PO TABS
100.0000 mg | ORAL_TABLET | Freq: Once | ORAL | Status: AC
  Administered 2019-03-14: 100 mg via ORAL
  Filled 2019-03-14: qty 1

## 2019-03-14 MED ORDER — METRONIDAZOLE 500 MG PO TABS
500.0000 mg | ORAL_TABLET | Freq: Once | ORAL | Status: AC
  Administered 2019-03-14: 500 mg via ORAL
  Filled 2019-03-14: qty 1

## 2019-03-14 MED ORDER — CEPHALEXIN 500 MG PO CAPS: 500.0000 mg | ORAL_CAPSULE | Freq: Two times a day (BID) | ORAL | 0 refills | Status: AC

## 2019-03-14 MED ORDER — METRONIDAZOLE 500 MG PO TABS: 500.0000 mg | ORAL_TABLET | Freq: Two times a day (BID) | ORAL | 0 refills | Status: AC

## 2019-03-14 MED ORDER — CEFTRIAXONE SODIUM 250 MG IJ SOLR
250.0000 mg | Freq: Once | INTRAMUSCULAR | Status: AC
  Administered 2019-03-14: 250 mg via INTRAMUSCULAR
  Filled 2019-03-14: qty 250

## 2019-03-14 MED ORDER — LIDOCAINE HCL (PF) 1 % IJ SOLN
INTRAMUSCULAR | Status: AC
  Administered 2019-03-14: 2 mL
  Filled 2019-03-14: qty 5

## 2019-03-14 MED ORDER — DOXYCYCLINE HYCLATE 100 MG PO CAPS: 100.0000 mg | ORAL_CAPSULE | Freq: Two times a day (BID) | ORAL | 0 refills | Status: AC

## 2019-03-14 MED FILL — METRONIDAZOLE 500 MG TABS: 500 | 14 days supply | Qty: 28 | Fill #0

## 2019-03-14 MED FILL — CEPHALEXIN 500 MG CAPSULE: 500 | 14 days supply | Qty: 28 | Fill #0

## 2019-03-14 MED FILL — DOXYCYCLINE HYCLATE 100 MG: 100 | 14 days supply | Qty: 28 | Fill #0

## 2019-03-14 NOTE — ED Triage Notes (Signed)
Pt reports dysuria, "severe burning" with urination, low back pain x last night. Denies any fevers. Also vaginal discharge since last week.

## 2019-03-14 NOTE — ED Notes (Signed)
Pelvic Cart at bedside. Patient undressed.

## 2019-03-14 NOTE — ED Provider Notes (Signed)
[facility] DEPARTMENT Provider Note   CSN: [id number] Arrival date & time: 03/14/19  8841     History   Chief Complaint Chief Complaint  Patient presents with   Vaginal Discharge    HPI Molly Mcknight is a 33 y.o. female.     The history is provided by the patient and medical records. No language interpreter was used.  Vaginal Discharge Quality:  White Severity:  Moderate Onset quality:  Gradual Duration:  3 days Timing:  Constant Progression:  Waxing and waning Chronicity:  Recurrent Relieved by:  Nothing Worsened by:  Nothing Ineffective treatments:  None tried Associated symptoms: abdominal pain (mild) and dysuria   Associated symptoms: no fever, no genital lesions, no nausea, no rash, no urinary hesitancy, no urinary incontinence and no vomiting   Risk factors: PID (hx)     Past Medical History:  Diagnosis Date   Anemia    Chlamydia    PID (pelvic inflammatory disease)    Trichimoniasis     There are no active problems to display for this patient.   Past Surgical History:  Procedure Laterality Date   AMPUTATION FINGER Right    CHOLECYSTECTOMY     TUBAL LIGATION       OB History    Gravida  3   Para  1   Term  1   Preterm  0   AB  1   Living  1     SAB  1   TAB  0   Ectopic  0   Multiple  0   Live Births  1            Home Medications    Prior to Admission medications   Medication Sig Start Date End Date Taking? Authorizing Provider  Acetaminophen (TYLENOL 8 HOUR PO) Take by mouth.    [provider]  doxycycline (VIBRAMYCIN) 100 MG capsule Take 1 capsule (100 mg total) by mouth 2 (two) times daily. 11/14/18   Volanda Napoleon, PA-C  metroNIDAZOLE (FLAGYL) 500 MG tablet Take 1 tablet (500 mg total) by mouth 2 (two) times daily. 01/03/19   Blanchie Dessert, MD  metroNIDAZOLE (METROGEL VAGINAL) 0.75 % vaginal gel Place 1 Applicatorful vaginally 2 (two) times daily. For flare up of  bacterial vaginosis 01/03/19   Blanchie Dessert, MD  naproxen (NAPROSYN) 500 MG tablet Take 1 tablet (500 mg total) by mouth 2 (two) times daily. 11/12/16   Blanchie Dessert, MD  polyethylene glycol powder (GLYCOLAX/MIRALAX) powder Take 17 g by mouth daily. Please take until you are having soft bowel movements daily.  17 g is 1 capful. 12/15/17   Albesa Seen, PA-C    Family History History reviewed. No pertinent family history.  Social History Social History   Tobacco Use   Smoking status: Former Smoker    Packs/day: 0.50   Smokeless tobacco: Never Used  Substance Use Topics   Alcohol use: Yes    Comment: occ   Drug use: No     Allergies   Patient has no known allergies.   Review of Systems Review of Systems  Constitutional: Negative for chills, diaphoresis, fatigue and fever.  HENT: Negative for congestion.   Eyes: Negative for visual disturbance.  Respiratory: Negative for cough, chest tightness, shortness of breath and wheezing.   Cardiovascular: Negative for chest pain and palpitations.  Gastrointestinal: Positive for abdominal pain (mild). Negative for constipation, diarrhea, nausea and vomiting.  Genitourinary: Positive for dysuria, flank pain, pelvic  pain and vaginal discharge. Negative for bladder incontinence, enuresis, frequency, hesitancy, urgency, vaginal bleeding and vaginal pain.  Musculoskeletal: Negative for back pain, neck pain and neck stiffness.  Skin: Negative for rash and wound.  Neurological: Negative for dizziness, light-headedness, numbness and headaches.  Psychiatric/Behavioral: Negative for agitation and confusion.  All other systems reviewed and are negative.    Physical Exam Updated Vital Signs BP 114/73 (BP Location: Right Arm)    Pulse (!) 53    Temp 98.2 F (36.8 C) (Oral)    Resp 18    Ht 5\' 1"  (1.549 m)    Wt 90.7 kg    LMP 03/06/2019    SpO2 98%    BMI 37.79 kg/m   Physical Exam Vitals signs and nursing note reviewed.    Constitutional:      General: She is not in acute distress.    Appearance: She is well-developed. She is not ill-appearing, toxic-appearing or diaphoretic.  HENT:     Head: Normocephalic and atraumatic.     Nose: No congestion or rhinorrhea.  Eyes:     Extraocular Movements: Extraocular movements intact.     Conjunctiva/sclera: Conjunctivae normal.     Pupils: Pupils are equal, round, and reactive to light.  Neck:     Musculoskeletal: Neck supple.  Cardiovascular:     Rate and Rhythm: Normal rate and regular rhythm.     Pulses: Normal pulses.     Heart sounds: No murmur.  Pulmonary:     Effort: Pulmonary effort is normal. No respiratory distress.     Breath sounds: Normal breath sounds. No wheezing, rhonchi or rales.  Chest:     Chest wall: No tenderness.  Abdominal:     General: Bowel sounds are normal. There is no distension.     Palpations: Abdomen is soft.     Tenderness: There is no abdominal tenderness. There is left CVA tenderness. There is no right CVA tenderness or guarding.    Genitourinary:    Vagina: Vaginal discharge present.     Cervix: Discharge present. No cervical motion tenderness.     Adnexa:        Right: No tenderness.         Left: No tenderness.       Comments: Not true CMT but mild discomfort on exam.  No adnexal tenderness. Musculoskeletal:        General: No tenderness.     Right lower leg: No edema.     Left lower leg: No edema.  Skin:    General: Skin is warm and dry.     Capillary Refill: Capillary refill takes less than 2 seconds.     Findings: No rash.  Neurological:     General: No focal deficit present.     Mental Status: She is alert.      ED Treatments / Results  Labs (all labs ordered are listed, but only abnormal results are displayed) Labs Reviewed  WET PREP, GENITAL - Abnormal; Notable for the following components:      Result Value   Clue Cells Wet Prep HPF POC PRESENT (*)    WBC, Wet Prep HPF POC MANY (*)    All other  components within normal limits  URINALYSIS, ROUTINE W REFLEX MICROSCOPIC - Abnormal; Notable for the following components:   APPearance CLOUDY (*)    Specific Gravity, Urine >1.030 (*)    Leukocytes,Ua MODERATE (*)    All other components within normal limits  COMPREHENSIVE METABOLIC PANEL -  Abnormal; Notable for the following components:   Potassium 3.3 (*)    Glucose, Bld 106 (*)    All other components within normal limits  URINALYSIS, MICROSCOPIC (REFLEX) - Abnormal; Notable for the following components:   Bacteria, UA MANY (*)    All other components within normal limits  URINE CULTURE  PREGNANCY, URINE  CBC WITH DIFFERENTIAL/PLATELET  LIPASE, BLOOD  WET PREP  (BD AFFIRM) ([facility])  GC/CHLAMYDIA PROBE AMP ([place]) NOT AT [facility]    EKG None  Radiology No results found.  Procedures Procedures (including critical care time)  Medications Ordered in ED Medications  cefTRIAXone (ROCEPHIN) injection 250 mg (250 mg Intramuscular Given 03/14/19 1456)  doxycycline (VIBRA-TABS) tablet 100 mg (100 mg Oral Given 03/14/19 1452)  metroNIDAZOLE (FLAGYL) tablet 500 mg (500 mg Oral Given 03/14/19 1452)  lidocaine (PF) (XYLOCAINE) 1 % injection (2 mLs  Given 03/14/19 1456)     Initial Impression / Assessment and Plan / ED Course  I have reviewed the triage vital signs and the nursing notes.  Pertinent labs & imaging results that were available during my care of the patient were reviewed by me and considered in my medical decision making (see chart for details).        Molly Mcknight is a 33 y.o. female with a past medical history significant for prior PID, prior chlamydia and trichomoniasis as well as prior cholecystectomy who presents with vaginal discharge, vaginal bleeding, pelvic pain, left flank pain, and dysuria.  Patient reports that her symptoms have been severe for the last several days.  She reports the pain is up to 11 out of 10 on her left flank and left groin area.   She reports no fevers or chills.  She denies nausea vomiting.  She denies constipation or diarrhea.  She denies trauma.  She reports that she just completed her menstrual cycle several days ago and still having some mild vaginal bleeding symptoms.  She reports that she has a discharge that is concerning for infection and is also having severe dysuria.  She reports it does feel similar to when she has had pelvic infection in the past and UTI.  She denies any history of kidney stones to her knowledge.  No history of diverticulitis.  On exam, patient's lungs are clear and chest is nontender.  Abdomen is nontender but left CVA area is tender to palpation.  Pelvic exam will be performed and swab will be obtained.  Given her history of PID and the discharge, will likely get pelvic ultrasound after pelvic exam, labs, and urine.  Anticipate reassessment after work-up.  Pelvic exam with a chaperone patient had very minimal tenderness.  No true cervical motion tenderness or adnexal tenderness.  She did not want to wait and get an ultrasound given the minimal amount of pain.  Labs returned showing BV but no evidence of trichomoniasis or yeast infection.  Patient does have evidence of UTI given the urinary symptoms and the left flank pain.  Suspect mild pyelonephritis.  Patient be treated empirically for PID, BV, and pyelonephritis.  Given lack of tenderness have lower suspicion for TOA or other abnormality requiring ultrasound or further management at this time.  Patient understands plan of care and return precautions.  She understands to follow-up with an OB/GYN and PCP.  She had no questions or concerns and was discharged in good condition.   Final Clinical Impressions(s) / ED Diagnoses   Final diagnoses:  BV (bacterial vaginosis)  Vaginal discharge  PID (pelvic inflammatory disease)  Pyelonephritis    ED Discharge Orders         Ordered    cephALEXin (KEFLEX) 500 MG capsule  2 times daily      03/14/19 1431    doxycycline (VIBRAMYCIN) 100 MG capsule  2 times daily     03/14/19 1431    metroNIDAZOLE (FLAGYL) 500 MG tablet  2 times daily     03/14/19 1431         Clinical Impression: 1. BV (bacterial vaginosis)   2. Vaginal discharge   3. PID (pelvic inflammatory disease)   4. Pyelonephritis     Disposition: Discharge  Condition: Good  I have discussed the results, Dx and Tx plan with the pt(& family if present). He/she/they expressed understanding and agree(s) with the plan. Discharge instructions discussed at great length. Strict return precautions discussed and pt &/or family have verbalized understanding of the instructions. No further questions at time of discharge.    Discharge Medication List as of 03/14/2019  2:32 PM    START taking these medications   Details  cephALEXin (KEFLEX) 500 MG capsule Take 1 capsule (500 mg total) by mouth 2 (two) times daily for 14 days., Starting Tue 03/14/2019, Until Tue 03/28/2019, Print    !! doxycycline (VIBRAMYCIN) 100 MG capsule Take 1 capsule (100 mg total) by mouth 2 (two) times daily for 14 days., Starting Tue 03/14/2019, Until Tue 03/28/2019, Print    !! metroNIDAZOLE (FLAGYL) 500 MG tablet Take 1 tablet (500 mg total) by mouth 2 (two) times daily for 14 days., Starting Tue 03/14/2019, Until Tue 03/28/2019, Print     !! - Potential duplicate medications found. Please discuss with provider.      Follow Up: [facility] [address] [place] [id number] [phone] Schedule an appointment as soon as possible for a visit    [facility] [address] [place] [phone] Schedule an appointment as soon as possible for a visit       Hovanes Hymas, Canary Brimhristopher J, MD 03/14/19 [phone]

## 2019-03-14 NOTE — Discharge Instructions (Signed)
Your work-up and exam today show concerns for urinary tract infection that may be causing some kidney infection called pyelonephritis.  You also are going to be treated with antibiotics for bacterial vaginosis empirically treated for pelvic inflammatory disease.  Please follow-up with your PCP and OB/GYN for further management.  Please stay hydrated and rest.  If any symptoms change or worsen, please return to nearest emergency department.

## 2019-03-15 LAB — URINE CULTURE

## 2019-03-15 LAB — GC/CHLAMYDIA PROBE AMP (~~LOC~~) NOT AT ARMC
Chlamydia: POSITIVE — AB
Neisseria Gonorrhea: POSITIVE — AB

## 2019-08-22 ENCOUNTER — Emergency Department (HOSPITAL_BASED_OUTPATIENT_CLINIC_OR_DEPARTMENT_OTHER)
Admission: EM | Admit: 2019-08-22 | Discharge: 2019-08-22 | Disposition: A | Discharge status: Home / Self Care | Payer: Self-pay | Attending: Emergency Medicine | Admitting: Emergency Medicine

## 2019-08-22 ENCOUNTER — Encounter (HOSPITAL_BASED_OUTPATIENT_CLINIC_OR_DEPARTMENT_OTHER): Payer: Self-pay | Admitting: Emergency Medicine

## 2019-08-22 ENCOUNTER — Emergency Department (HOSPITAL_BASED_OUTPATIENT_CLINIC_OR_DEPARTMENT_OTHER): Payer: Self-pay

## 2019-08-22 ENCOUNTER — Other Ambulatory Visit: Payer: Self-pay

## 2019-08-22 DIAGNOSIS — N12 Tubulo-interstitial nephritis, not specified as acute or chronic: Secondary | ICD-10-CM | POA: Insufficient documentation

## 2019-08-22 DIAGNOSIS — N200 Calculus of kidney: Secondary | ICD-10-CM | POA: Insufficient documentation

## 2019-08-22 DIAGNOSIS — Z87891 Personal history of nicotine dependence: Secondary | ICD-10-CM | POA: Insufficient documentation

## 2019-08-22 DIAGNOSIS — Z20828 Contact with and (suspected) exposure to other viral communicable diseases: Secondary | ICD-10-CM | POA: Insufficient documentation

## 2019-08-22 DIAGNOSIS — J069 Acute upper respiratory infection, unspecified: Secondary | ICD-10-CM | POA: Insufficient documentation

## 2019-08-22 DIAGNOSIS — J029 Acute pharyngitis, unspecified: Secondary | ICD-10-CM

## 2019-08-22 DIAGNOSIS — R109 Unspecified abdominal pain: Secondary | ICD-10-CM

## 2019-08-22 DIAGNOSIS — Z79899 Other long term (current) drug therapy: Secondary | ICD-10-CM | POA: Insufficient documentation

## 2019-08-22 LAB — URINALYSIS, ROUTINE W REFLEX MICROSCOPIC
Bilirubin Urine: NEGATIVE
Glucose, UA: NEGATIVE mg/dL
Hgb urine dipstick: NEGATIVE
Ketones, ur: NEGATIVE mg/dL
Nitrite: NEGATIVE
Protein, ur: NEGATIVE mg/dL
Specific Gravity, Urine: >1.03 — ABNORMAL HIGH (ref 1.005–1.030)
pH: 5.5 (ref 5.0–8.0)

## 2019-08-22 LAB — COMPREHENSIVE METABOLIC PANEL
ALT: 18 U/L (ref 0–44)
AST: 16 U/L (ref 15–41)
Albumin: 4.1 g/dL (ref 3.5–5.0)
Alkaline Phosphatase: 55 U/L (ref 38–126)
Anion gap: 8 (ref 5–15)
BUN: 9 mg/dL (ref 6–20)
CO2: 24 mmol/L (ref 22–32)
Calcium: 9 mg/dL (ref 8.9–10.3)
Chloride: 107 mmol/L (ref 98–111)
Creatinine, Ser: 0.65 mg/dL (ref 0.44–1.00)
GFR calc Af Amer: >60 mL/min (ref >60)
GFR calc non Af Amer: >60 mL/min (ref >60)
Glucose, Bld: 100 mg/dL — ABNORMAL HIGH (ref 70–99)
Potassium: 3.9 mmol/L (ref 3.5–5.1)
Sodium: 139 mmol/L (ref 135–145)
Total Bilirubin: 0.6 mg/dL (ref 0.3–1.2)
Total Protein: 7.2 g/dL (ref 6.5–8.1)

## 2019-08-22 LAB — CBC WITH DIFFERENTIAL/PLATELET
Abs Immature Granulocytes: 0.02 10*3/uL (ref 0.00–0.07)
Basophils Absolute: 0 10*3/uL (ref 0.0–0.1)
Basophils Relative: 1 %
Eosinophils Absolute: 0.1 10*3/uL (ref 0.0–0.5)
Eosinophils Relative: 1 %
HCT: 40.5 % (ref 36.0–46.0)
Hemoglobin: 13.1 g/dL (ref 12.0–15.0)
Immature Granulocytes: 0 %
Lymphocytes Relative: 25 %
Lymphs Abs: 1.6 10*3/uL (ref 0.7–4.0)
MCH: 29.6 pg (ref 26.0–34.0)
MCHC: 32.3 g/dL (ref 30.0–36.0)
MCV: 91.6 fL (ref 80.0–100.0)
Monocytes Absolute: 0.5 10*3/uL (ref 0.1–1.0)
Monocytes Relative: 8 %
Neutro Abs: 4.2 10*3/uL (ref 1.7–7.7)
Neutrophils Relative %: 65 %
Platelets: 195 10*3/uL (ref 150–400)
RBC: 4.42 MIL/uL (ref 3.87–5.11)
RDW: 12.3 % (ref 11.5–15.5)
WBC: 6.4 10*3/uL (ref 4.0–10.5)
nRBC: 0 % (ref 0.0–0.2)

## 2019-08-22 LAB — GROUP A STREP BY PCR: Group A Strep by PCR: NOT DETECTED

## 2019-08-22 LAB — LIPASE, BLOOD: Lipase: 27 U/L (ref 11–51)

## 2019-08-22 LAB — URINALYSIS, MICROSCOPIC (REFLEX)

## 2019-08-22 LAB — SARS CORONAVIRUS 2 AG (30 MIN TAT): SARS Coronavirus 2 Ag: NEGATIVE

## 2019-08-22 LAB — PREGNANCY, URINE: Preg Test, Ur: NEGATIVE

## 2019-08-22 MED ORDER — MORPHINE SULFATE (PF) 4 MG/ML IV SOLN
4.0000 mg | Freq: Once | INTRAVENOUS | Status: AC
  Administered 2019-08-22: 10:00:00 4 mg via INTRAVENOUS
  Filled 2019-08-22: qty 1

## 2019-08-22 MED ORDER — SODIUM CHLORIDE 0.9 % IV BOLUS
1000.0000 mL | Freq: Once | INTRAVENOUS | Status: AC
  Administered 2019-08-22: 10:00:00 1000 mL via INTRAVENOUS

## 2019-08-22 MED ORDER — ONDANSETRON HCL 4 MG PO TABS: 4.0000 mg | ORAL_TABLET | Freq: Three times a day (TID) | ORAL | 0 refills | Status: DC | PRN

## 2019-08-22 MED ORDER — CEPHALEXIN 500 MG PO CAPS: 500.0000 mg | ORAL_CAPSULE | Freq: Four times a day (QID) | ORAL | 0 refills | Status: AC

## 2019-08-22 MED ORDER — HYDROCODONE-ACETAMINOPHEN 5-325 MG PO TABS: 1.0000 | ORAL_TABLET | ORAL | 0 refills | Status: DC | PRN

## 2019-08-22 MED ORDER — MORPHINE SULFATE (PF) 4 MG/ML IV SOLN
4.0000 mg | Freq: Once | INTRAVENOUS | Status: AC
  Administered 2019-08-22: 11:00:00 4 mg via INTRAVENOUS
  Filled 2019-08-22: qty 1

## 2019-08-22 MED ORDER — ONDANSETRON HCL 4 MG/2ML IJ SOLN
4.0000 mg | Freq: Once | INTRAMUSCULAR | Status: AC
  Administered 2019-08-22: 10:00:00 4 mg via INTRAVENOUS
  Filled 2019-08-22: qty 2

## 2019-08-22 MED FILL — CEPHALEXIN 500 MG CAPSULE: 500 | 10 days supply | Qty: 40 | Fill #0

## 2019-08-22 MED FILL — HYDROCODON-APAP 5-325: 5-325 | 2 days supply | Qty: 10 | Fill #0

## 2019-08-22 NOTE — ED Triage Notes (Signed)
Sore throat and R low back pain since last night.

## 2019-08-22 NOTE — Discharge Instructions (Signed)
Your work-up today did show evidence of urinary tract infection likely causing pyelonephritis and your right flank pain with nausea vomiting and feeling bad.  The CT scan did not show any obstructing stones but you do have small bilateral stones in both of your kidneys.  Please stay hydrated and rest.  Please use the antibiotics to treat the infection.  Please use the nausea and pain medicine to help maintain hydration and rest.  Please follow-up with a PCP.  If any symptoms change or worsen, please return to the nearest emergency department.  Please follow-up on your Covid results tomorrow.

## 2019-08-22 NOTE — ED Provider Notes (Signed)
[facility] EMERGENCY DEPARTMENT Provider Note   CSN: [id number] Arrival date & time: 08/22/19  0848     History Chief Complaint  Patient presents with  . Sore Throat  . Back Pain    Molly Mcknight is a 33 y.o. female.  The history is provided by the patient and medical records. No language interpreter was used.  Flank Pain This is a new problem. The current episode started yesterday. The problem occurs constantly. Associated symptoms include abdominal pain. Pertinent negatives include no chest pain, no headaches and no shortness of breath. Nothing aggravates the symptoms. Nothing relieves the symptoms. She has tried nothing for the symptoms. The treatment provided no relief.       Past Medical History:  Diagnosis Date  . Anemia   . Chlamydia   . PID (pelvic inflammatory disease)   . Trichimoniasis     There are no problems to display for this patient.   Past Surgical History:  Procedure Laterality Date  . AMPUTATION FINGER Right   . CHOLECYSTECTOMY    . TUBAL LIGATION       OB History    Gravida  3   Para  1   Term  1   Preterm  0   AB  1   Living  1     SAB  1   TAB  0   Ectopic  0   Multiple  0   Live Births  1           No family history on file.  Social History   Tobacco Use  . Smoking status: Former Smoker    Packs/day: 0.50  . Smokeless tobacco: Never Used  Substance Use Topics  . Alcohol use: Yes    Comment: occ  . Drug use: No    Home Medications Prior to Admission medications   Medication Sig Start Date End Date Taking? Authorizing Provider  Acetaminophen (TYLENOL 8 HOUR PO) Take by mouth.    [provider]  doxycycline (VIBRAMYCIN) 100 MG capsule Take 1 capsule (100 mg total) by mouth 2 (two) times daily. 11/14/18   Maxwell Caul, PA-C  metroNIDAZOLE (FLAGYL) 500 MG tablet Take 1 tablet (500 mg total) by mouth 2 (two) times daily. 01/03/19   Gwyneth Sprout, MD  metroNIDAZOLE (METROGEL  VAGINAL) 0.75 % vaginal gel Place 1 Applicatorful vaginally 2 (two) times daily. For flare up of bacterial vaginosis 01/03/19   Gwyneth Sprout, MD  naproxen (NAPROSYN) 500 MG tablet Take 1 tablet (500 mg total) by mouth 2 (two) times daily. 11/12/16   Gwyneth Sprout, MD  polyethylene glycol powder (GLYCOLAX/MIRALAX) powder Take 17 g by mouth daily. Please take until you are having soft bowel movements daily.  17 g is 1 capful. 12/15/17   Aviva Kluver B, PA-C    Allergies    Patient has no known allergies.  Review of Systems   Review of Systems  Constitutional: Positive for chills. Negative for diaphoresis, fatigue and fever.  HENT: Positive for congestion, rhinorrhea and sore throat.   Eyes: Negative for visual disturbance.  Respiratory: Negative for cough, chest tightness, shortness of breath and wheezing.   Cardiovascular: Negative for chest pain, palpitations and leg swelling.  Gastrointestinal: Positive for abdominal pain, nausea and vomiting. Negative for abdominal distention, constipation and diarrhea.  Genitourinary: Positive for flank pain. Negative for dysuria and frequency.  Musculoskeletal: Positive for back pain. Negative for neck pain and neck stiffness.  Skin: Negative for rash and  wound.  Neurological: Negative for light-headedness, numbness and headaches.  Psychiatric/Behavioral: Negative for agitation and confusion.  All other systems reviewed and are negative.   Physical Exam Updated Vital Signs BP 97/76 (BP Location: Right Arm)   Pulse 65   Temp 98.4 F (36.9 C) (Oral)   Resp 14   Ht 5\' 1"  (1.549 m)   Wt 90.7 kg   LMP 08/13/2019   SpO2 99%   BMI 37.79 kg/m   Physical Exam Vitals and nursing note reviewed.  Constitutional:      General: She is not in acute distress.    Appearance: She is well-developed. She is not ill-appearing, toxic-appearing or diaphoretic.  HENT:     Head: Normocephalic and atraumatic.     Right Ear: External ear normal.      Left Ear: External ear normal.     Nose: Nose normal. No congestion or rhinorrhea.     Mouth/Throat:     Mouth: Mucous membranes are moist.     Pharynx: No oropharyngeal exudate.  Eyes:     Conjunctiva/sclera: Conjunctivae normal.     Pupils: Pupils are equal, round, and reactive to light.  Cardiovascular:     Rate and Rhythm: Normal rate.     Pulses: Normal pulses.     Heart sounds: Normal heart sounds. No murmur.  Pulmonary:     Effort: Pulmonary effort is normal. No respiratory distress.     Breath sounds: No stridor. No wheezing, rhonchi or rales.  Chest:     Chest wall: No tenderness.  Abdominal:     General: Abdomen is flat. There is no distension.     Tenderness: There is abdominal tenderness (flank). There is right CVA tenderness. There is no left CVA tenderness or rebound.    Musculoskeletal:        General: Tenderness present.     Cervical back: Normal range of motion and neck supple.       Back:     Right lower leg: No edema.     Left lower leg: No edema.  Skin:    General: Skin is warm.     Capillary Refill: Capillary refill takes less than 2 seconds.     Findings: No erythema or rash.  Neurological:     General: No focal deficit present.     Mental Status: She is alert and oriented to person, place, and time.     Motor: No abnormal muscle tone.     Coordination: Coordination normal.     Deep Tendon Reflexes: Reflexes are normal and symmetric.  Psychiatric:        Mood and Affect: Mood normal.     ED Results / Procedures / Treatments   Labs (all labs ordered are listed, but only abnormal results are displayed) Labs Reviewed  COMPREHENSIVE METABOLIC PANEL - Abnormal; Notable for the following components:      Result Value   Glucose, Bld 100 (*)    All other components within normal limits  URINALYSIS, ROUTINE W REFLEX MICROSCOPIC - Abnormal; Notable for the following components:   Specific Gravity, Urine >1.030 (*)    Leukocytes,Ua TRACE (*)    All  other components within normal limits  URINALYSIS, MICROSCOPIC (REFLEX) - Abnormal; Notable for the following components:   Bacteria, UA FEW (*)    All other components within normal limits  GROUP A STREP BY PCR  SARS CORONAVIRUS 2 AG (30 MIN TAT)  URINE CULTURE  NOVEL CORONAVIRUS, NAA (HOSP ORDER, SEND-OUT TO  REF LAB; TAT 18-24 HRS)  PREGNANCY, URINE  LIPASE, BLOOD  CBC WITH DIFFERENTIAL/PLATELET    EKG None  Radiology CT Renal Stone Study  Result Date: 08/22/2019 CLINICAL DATA:  Right-sided flank pain, nausea, vomiting EXAM: CT ABDOMEN AND PELVIS WITHOUT CONTRAST TECHNIQUE: Multidetector CT imaging of the abdomen and pelvis was performed following the standard protocol without IV contrast. COMPARISON:  Pelvic ultrasound, 11/14/2018 FINDINGS: Lower chest: No acute abnormality. Hepatobiliary: No focal liver abnormality is seen. Status post cholecystectomy. Postoperative biliary dilatation. Pancreas: Unremarkable. No pancreatic ductal dilatation or surrounding inflammatory changes. Spleen: Normal in size without significant abnormality. Adrenals/Urinary Tract: Adrenal glands are unremarkable. Multiple tiny bilateral nonobstructive renal calculi. No ureteral calculi or hydronephrosis. Bladder is unremarkable. Stomach/Bowel: Stomach is within normal limits. Appendix appears normal. No evidence of bowel wall thickening, distention, or inflammatory changes. Vascular/Lymphatic: No significant vascular findings are present. No enlarged abdominal or pelvic lymph nodes. Reproductive: No mass or other significant abnormality. The right ovary likely contains cysts or follicles, in keeping with appearance on prior ultrasound. Other: No abdominal wall hernia or abnormality. No abdominopelvic ascites. Musculoskeletal: No acute or significant osseous findings. Chronic bilateral pars defects of L5. IMPRESSION: Multiple tiny bilateral nonobstructive renal calculi. No ureteral calculi or hydronephrosis.  Electronically Signed   By: Lauralyn Primes M.D.   On: 08/22/2019 12:16    Procedures Procedures (including critical care time)  Medications Ordered in ED Medications  ondansetron (ZOFRAN) injection 4 mg (4 mg Intravenous Given 08/22/19 1021)  sodium chloride 0.9 % bolus 1,000 mL (0 mLs Intravenous Stopped 08/22/19 1127)  morphine 4 MG/ML injection 4 mg (4 mg Intravenous Given 08/22/19 1021)  morphine 4 MG/ML injection 4 mg (4 mg Intravenous Given 08/22/19 1126)    ED Course  I have reviewed the triage vital signs and the nursing notes.  Pertinent labs & imaging results that were available during my care of the patient were reviewed by me and considered in my medical decision making (see chart for details).    MDM Rules/Calculators/A&P                      SONDOS WOLFMAN is a 33 y.o. female with a past medical history significant for STI, prior cholecystectomy, and chronic anemia who presents with URI symptoms of rhinorrhea, congestion, chills, and sore throat as well as new right CVA/right flank pain with nausea and vomiting.  Patient reports that she has URI symptoms for a few days as well as sore throat.  She has not had any known Covid contacts but she is concerned about this.  She reports that she has had right low back pain since last night that radiates around towards her right flank.  She has no history of kidney stones and has already had her gallbladder removed.  She reports it is not on her anterior abdomen and she denies any dysuria.  She denies any vaginal symptoms.  She denies any new constipation or diarrhea.  She denies trauma.  She reports the pain is severe and associated with nausea and vomiting.  On exam, lungs are clear and chest is nontender.  Anterior abdomen is nontender but right flank and right CVA is exquisitely tender.  She is having nausea vomiting and dry heaving on my exam.  Her oropharyngeal exam was unremarkable and her neck was nontender.  Back was otherwise  nontender.   Clinically I suspect patient has URI causing her rhinorrhea congestion and throat symptoms.  Rapid strep test  was collected and was negative.  She will be Covid tested with a 15-minute test and subsequently a PCR test if is negative.  Her flank and back pain is concerning for possible kidney stone versus pyelonephritis.  We will get CT stone study, urinalysis, and labs.  She will be given pain medicine, nausea medicine, and fluids.  She reports that her father could come pick her up after pain medicine.  Anticipate reassessment after work-up.     CT scan did not show any ureteral stone or obstructing stone but it did show she has kidney stones up in her kidneys bilaterally.  Her urinalysis does show leukocytes and bacteria.  Given her nausea vomiting, CVA tenderness and flank pain and malaise, she will be treated for pyelonephritis.  Culture was sent.  A PCR Covid test was also sent.  As her symptoms improved with medications and there is no evidence of sepsis.  Patient was felt stable for discharge home.  She will be given antibiotics for pyelonephritis as well as presents for pain and nausea medicine.  She will follow-up with PCP.  Other work-up reassuring.  Patient agreed with plan of care and discharge.  Patient discharged in good condition.    Final Clinical Impression(s) / ED Diagnoses Final diagnoses:  Pyelonephritis  Viral upper respiratory tract infection  Sore throat  Right flank pain  Bilateral kidney stones    Rx / DC Orders ED Discharge Orders         Ordered    cephALEXin (KEFLEX) 500 MG capsule  4 times daily     08/22/19 1423    HYDROcodone-acetaminophen (NORCO/VICODIN) 5-325 MG tablet  Every 4 hours PRN     08/22/19 1423    ondansetron (ZOFRAN) 4 MG tablet  Every 8 hours PRN     08/22/19 1423          Clinical Impression: 1. Pyelonephritis   2. Viral upper respiratory tract infection   3. Sore throat   4. Right flank pain   5. Bilateral kidney  stones     Disposition: Discharge  Condition: Good  I have discussed the results, Dx and Tx plan with the pt(& family if present). He/she/they expressed understanding and agree(s) with the plan. Discharge instructions discussed at great length. Strict return precautions discussed and pt &/or family have verbalized understanding of the instructions. No further questions at time of discharge.    Discharge Medication List as of 08/22/2019  2:24 PM    START taking these medications   Details  cephALEXin (KEFLEX) 500 MG capsule Take 1 capsule (500 mg total) by mouth 4 (four) times daily for 10 days., Starting Tue 08/22/2019, Until Fri 09/01/2019, Normal    HYDROcodone-acetaminophen (NORCO/VICODIN) 5-325 MG tablet Take 1 tablet by mouth every 4 (four) hours as needed., Starting Tue 08/22/2019, Normal    ondansetron (ZOFRAN) 4 MG tablet Take 1 tablet (4 mg total) by mouth every 8 (eight) hours as needed for nausea or vomiting., Starting Tue 08/22/2019, Normal        Follow Up: No follow-up provider specified.    Vanecia Limpert, Gwenyth Allegra, MD 08/22/19 [phone]

## 2019-08-23 LAB — NOVEL CORONAVIRUS, NAA (HOSP ORDER, SEND-OUT TO REF LAB; TAT 18-24 HRS): SARS-CoV-2, NAA: NOT DETECTED

## 2019-08-23 LAB — URINE CULTURE: Culture: 40000 — AB

## 2019-08-24 ENCOUNTER — Telehealth: Payer: Self-pay | Admitting: Emergency Medicine

## 2019-08-24 NOTE — Telephone Encounter (Signed)
Post ED Visit - Positive Culture Follow-up  Culture report reviewed by antimicrobial stewardship pharmacist: [facility] []  Elenor Quinones, Pharm.D. []  Heide Guile, Pharm.D., BCPS AQ-ID []  Parks Neptune, Pharm.D., BCPS []  Alycia Rossetti, Pharm.D., BCPS []  [place], [place].D., BCPS, AAHIVP []  Legrand Como, Pharm.D., BCPS, AAHIVP []  Salome Arnt, PharmD, BCPS []  Johnnette Gourd, PharmD, BCPS []  Hughes Better, PharmD, BCPS []  Leeroy Cha, PharmD [x]  Laqueta Linden, PharmD, BCPS []  Albertina Parr, PharmD  [place] Team []  Leodis Sias, PharmD []  Lindell Spar, PharmD []  Royetta Asal, PharmD []  Graylin Shiver, Rph []  Rema Fendt) Glennon Mac, PharmD []  Arlyn Dunning, PharmD []  Netta Cedars, PharmD []  Dia Sitter, PharmD []  Leone Haven, PharmD []  Gretta Arab, PharmD []  Theodis Shove, PharmD []  Peggyann Juba, PharmD []  Reuel Boom, PharmD   Positive urine culture Treated with cephalexin, organism sensitive to the same and no further patient follow-up is required at this time.  Hazle Nordmann 08/24/2019, 3:40 PM

## 2019-09-10 ENCOUNTER — Other Ambulatory Visit: Payer: Self-pay

## 2019-09-10 ENCOUNTER — Encounter (HOSPITAL_BASED_OUTPATIENT_CLINIC_OR_DEPARTMENT_OTHER): Payer: Self-pay | Admitting: Emergency Medicine

## 2019-09-10 ENCOUNTER — Emergency Department (HOSPITAL_BASED_OUTPATIENT_CLINIC_OR_DEPARTMENT_OTHER)
Admission: EM | Admit: 2019-09-10 | Discharge: 2019-09-10 | Disposition: A | Discharge status: Home / Self Care | Payer: 59 | Attending: Emergency Medicine | Admitting: Emergency Medicine

## 2019-09-10 DIAGNOSIS — M549 Dorsalgia, unspecified: Secondary | ICD-10-CM | POA: Insufficient documentation

## 2019-09-10 DIAGNOSIS — H6690 Otitis media, unspecified, unspecified ear: Secondary | ICD-10-CM

## 2019-09-10 DIAGNOSIS — R300 Dysuria: Secondary | ICD-10-CM | POA: Insufficient documentation

## 2019-09-10 DIAGNOSIS — H6691 Otitis media, unspecified, right ear: Secondary | ICD-10-CM | POA: Insufficient documentation

## 2019-09-10 DIAGNOSIS — R1011 Right upper quadrant pain: Secondary | ICD-10-CM | POA: Insufficient documentation

## 2019-09-10 DIAGNOSIS — R109 Unspecified abdominal pain: Secondary | ICD-10-CM

## 2019-09-10 LAB — URINALYSIS, ROUTINE W REFLEX MICROSCOPIC
Bilirubin Urine: NEGATIVE
Glucose, UA: NEGATIVE mg/dL
Ketones, ur: NEGATIVE mg/dL
Nitrite: NEGATIVE
Protein, ur: NEGATIVE mg/dL
Specific Gravity, Urine: 1.025 (ref 1.005–1.030)
pH: 6 (ref 5.0–8.0)

## 2019-09-10 LAB — PREGNANCY, URINE: Preg Test, Ur: NEGATIVE

## 2019-09-10 LAB — URINALYSIS, MICROSCOPIC (REFLEX)

## 2019-09-10 MED ORDER — FLUTICASONE PROPIONATE 50 MCG/ACT NA SUSP: 1.0000 | Freq: Every day | NASAL | 0 refills | Status: DC

## 2019-09-10 MED ORDER — AMOXICILLIN 500 MG PO CAPS: 500.0000 mg | ORAL_CAPSULE | Freq: Three times a day (TID) | ORAL | 0 refills | Status: DC

## 2019-09-10 MED ORDER — AMOXICILLIN 500 MG PO CAPS
500.0000 mg | ORAL_CAPSULE | Freq: Once | ORAL | Status: AC
  Administered 2019-09-10: 500 mg via ORAL
  Filled 2019-09-10: qty 1

## 2019-09-10 NOTE — ED Notes (Signed)
Pt verbalized understanding to pick up Rx at pharmacy listed on d/c instructions 

## 2019-09-10 NOTE — ED Provider Notes (Signed)
[facility] EMERGENCY DEPARTMENT Provider Note   CSN: [id number] Arrival date & time: 09/10/19  1757     History Chief Complaint  Patient presents with  . Multiple complaints    Molly Mcknight is a 34 y.o. female.  The history is provided by the patient and medical records. No language interpreter was used.   Molly Mcknight is a 34 y.o. female who presents to the Emergency Department complaining of back pain, ear ache.  She complains of one week of right flank pain.  Pain is constant and sharp in nature, non-radiating.  Pain is worse with urination and she has associated dysuria and frequency.  No vaginal discharge.  No fever, N/V, lower extremity numbness/weakness.    She also complains of two weeks of right ear pain.  She has decreased hearing in the right ear.  She has pain with closing her lower right eyelid.  No facial/dental pain, sore throat.  Has mild nasal congestion - she has been taking cold medication.   Sxs are moderate and constant in nature.     Past Medical History:  Diagnosis Date  . Anemia   . Chlamydia   . PID (pelvic inflammatory disease)   . Trichimoniasis     There are no problems to display for this patient.   Past Surgical History:  Procedure Laterality Date  . AMPUTATION FINGER Right   . CHOLECYSTECTOMY    . TUBAL LIGATION       OB History    Gravida  3   Para  1   Term  1   Preterm  0   AB  1   Living  1     SAB  1   TAB  0   Ectopic  0   Multiple  0   Live Births  1           No family history on file.  Social History   Tobacco Use  . Smoking status: Former Smoker    Packs/day: 0.50  . Smokeless tobacco: Never Used  Substance Use Topics  . Alcohol use: Yes    Comment: occ  . Drug use: No    Home Medications Prior to Admission medications   Medication Sig Start Date End Date Taking? Authorizing Provider  Acetaminophen (TYLENOL 8 HOUR PO) Take by mouth.    [provider]    amoxicillin (AMOXIL) 500 MG capsule Take 1 capsule (500 mg total) by mouth 3 (three) times daily. 09/10/19   Quintella Reichert, MD  doxycycline (VIBRAMYCIN) 100 MG capsule Take 1 capsule (100 mg total) by mouth 2 (two) times daily. 11/14/18   Volanda Napoleon, PA-C  fluticasone (FLONASE) 50 MCG/ACT nasal spray Place 1 spray into both nostrils daily. 09/10/19   Quintella Reichert, MD  HYDROcodone-acetaminophen (NORCO/VICODIN) 5-325 MG tablet Take 1 tablet by mouth every 4 (four) hours as needed. 08/22/19   Tegeler, Gwenyth Allegra, MD  metroNIDAZOLE (FLAGYL) 500 MG tablet Take 1 tablet (500 mg total) by mouth 2 (two) times daily. 01/03/19   Blanchie Dessert, MD  metroNIDAZOLE (METROGEL VAGINAL) 0.75 % vaginal gel Place 1 Applicatorful vaginally 2 (two) times daily. For flare up of bacterial vaginosis 01/03/19   Blanchie Dessert, MD  naproxen (NAPROSYN) 500 MG tablet Take 1 tablet (500 mg total) by mouth 2 (two) times daily. 11/12/16   Blanchie Dessert, MD  ondansetron (ZOFRAN) 4 MG tablet Take 1 tablet (4 mg total) by mouth every 8 (eight) hours as needed  for nausea or vomiting. 08/22/19   Tegeler, Canary Brim, MD  polyethylene glycol powder (GLYCOLAX/MIRALAX) powder Take 17 g by mouth daily. Please take until you are having soft bowel movements daily.  17 g is 1 capful. 12/15/17   Aviva Kluver B, PA-C    Allergies    Patient has no known allergies.  Review of Systems   Review of Systems  All other systems reviewed and are negative.   Physical Exam Updated Vital Signs BP 113/73 (BP Location: Left Arm)   Pulse (!) 53   Temp 98 F (36.7 C) (Oral)   Resp 16   Ht 5\' 1"  (1.549 m)   Wt 90.7 kg   LMP 08/13/2019   SpO2 100%   BMI 37.79 kg/m   Physical Exam Vitals and nursing note reviewed.  Constitutional:      Appearance: She is well-developed.  HENT:     Head: Normocephalic and atraumatic.     Comments: R TM with erythema, dull light reflex.  L TM wnl.  Mild edema of nasal mucosa. No  significant erythema or edema in posterior OP.  Mild erythema and swelling of lateral aspect of right lower eyelid without focal abscess, rash.  No conjunctival injection.  EOMI.  PERRL.   Cardiovascular:     Rate and Rhythm: Normal rate and regular rhythm.  Pulmonary:     Effort: Pulmonary effort is normal. No respiratory distress.  Abdominal:     Palpations: Abdomen is soft.     Tenderness: There is no abdominal tenderness. There is no guarding or rebound.     Comments: Mild right CVA tenderness  Musculoskeletal:        General: No tenderness.  Skin:    General: Skin is warm and dry.  Neurological:     Mental Status: She is alert and oriented to person, place, and time.     Comments: 5/5 strength in BLE with sensation to light touch intact in BLE.   Psychiatric:        Behavior: Behavior normal.     ED Results / Procedures / Treatments   Labs (all labs ordered are listed, but only abnormal results are displayed) Labs Reviewed  URINALYSIS, ROUTINE W REFLEX MICROSCOPIC - Abnormal; Notable for the following components:      Result Value   Hgb urine dipstick MODERATE (*)    Leukocytes,Ua MODERATE (*)    All other components within normal limits  URINALYSIS, MICROSCOPIC (REFLEX) - Abnormal; Notable for the following components:   Bacteria, UA FEW (*)    All other components within normal limits  URINE CULTURE  PREGNANCY, URINE    EKG None  Radiology No results found.  Procedures Procedures (including critical care time)  Medications Ordered in ED Medications  amoxicillin (AMOXIL) capsule 500 mg (500 mg Oral Given 09/10/19 2039)    ED Course  I have reviewed the triage vital signs and the nursing notes.  Pertinent labs & imaging results that were available during my care of the patient were reviewed by me and considered in my medical decision making (see chart for details).    MDM Rules/Calculators/A&P                     Patient here for evaluation of right  sided back pain as well as right sided earache. She is non-toxic appearing on evaluation. In terms of her back pain she does have some local tenderness, no overlying rash. UA is not consistent with UTI.  Doubt renal colic. Discussed with patient home care for back pain. In terms of her ear pain, examination is consistent with acute otitis media. Discussed home care, outpatient follow-up and return precautions.  Final Clinical Impression(s) / ED Diagnoses Final diagnoses:  Acute otitis media, unspecified otitis media type  Right flank pain    Rx / DC Orders ED Discharge Orders         Ordered    amoxicillin (AMOXIL) 500 MG capsule  3 times daily     09/10/19 2026    fluticasone (FLONASE) 50 MCG/ACT nasal spray  Daily     09/10/19 2026           Tilden Fossa, MD 09/10/19 2305

## 2019-09-10 NOTE — ED Notes (Signed)
ED Provider at bedside. 

## 2019-09-10 NOTE — ED Triage Notes (Signed)
R low back pain x 2 weeks. Was seen here and states no improvement with the meds given. Also reports decreased hearing in R ear and R eye pain. Denies visual changes.

## 2019-09-11 LAB — URINE CULTURE: Culture: NO GROWTH

## 2020-01-26 ENCOUNTER — Emergency Department (HOSPITAL_BASED_OUTPATIENT_CLINIC_OR_DEPARTMENT_OTHER)
Admission: EM | Admit: 2020-01-26 | Discharge: 2020-01-26 | Disposition: A | Discharge status: Home / Self Care | Payer: Self-pay | Attending: Emergency Medicine | Admitting: Emergency Medicine

## 2020-01-26 ENCOUNTER — Emergency Department (HOSPITAL_BASED_OUTPATIENT_CLINIC_OR_DEPARTMENT_OTHER): Payer: Self-pay

## 2020-01-26 ENCOUNTER — Encounter (HOSPITAL_BASED_OUTPATIENT_CLINIC_OR_DEPARTMENT_OTHER): Payer: Self-pay | Admitting: Emergency Medicine

## 2020-01-26 DIAGNOSIS — Z87891 Personal history of nicotine dependence: Secondary | ICD-10-CM | POA: Insufficient documentation

## 2020-01-26 DIAGNOSIS — N83202 Unspecified ovarian cyst, left side: Secondary | ICD-10-CM | POA: Insufficient documentation

## 2020-01-26 DIAGNOSIS — N739 Female pelvic inflammatory disease, unspecified: Secondary | ICD-10-CM | POA: Insufficient documentation

## 2020-01-26 LAB — CBC WITH DIFFERENTIAL/PLATELET
Abs Immature Granulocytes: 0.01 10*3/uL (ref 0.00–0.07)
Basophils Absolute: 0 10*3/uL (ref 0.0–0.1)
Basophils Relative: 1 %
Eosinophils Absolute: 0.1 10*3/uL (ref 0.0–0.5)
Eosinophils Relative: 1 %
HCT: 38.8 % (ref 36.0–46.0)
Hemoglobin: 12.9 g/dL (ref 12.0–15.0)
Immature Granulocytes: 0 %
Lymphocytes Relative: 31 %
Lymphs Abs: 1.8 10*3/uL (ref 0.7–4.0)
MCH: 29.3 pg (ref 26.0–34.0)
MCHC: 33.2 g/dL (ref 30.0–36.0)
MCV: 88.2 fL (ref 80.0–100.0)
Monocytes Absolute: 0.5 10*3/uL (ref 0.1–1.0)
Monocytes Relative: 8 %
Neutro Abs: 3.5 10*3/uL (ref 1.7–7.7)
Neutrophils Relative %: 59 %
Platelets: 189 10*3/uL (ref 150–400)
RBC: 4.4 MIL/uL (ref 3.87–5.11)
RDW: 12.7 % (ref 11.5–15.5)
WBC: 5.9 10*3/uL (ref 4.0–10.5)
nRBC: 0 % (ref 0.0–0.2)

## 2020-01-26 LAB — URINALYSIS, ROUTINE W REFLEX MICROSCOPIC
Bilirubin Urine: NEGATIVE
Glucose, UA: NEGATIVE mg/dL
Hgb urine dipstick: NEGATIVE
Ketones, ur: NEGATIVE mg/dL
Leukocytes,Ua: NEGATIVE
Nitrite: NEGATIVE
Protein, ur: NEGATIVE mg/dL
Specific Gravity, Urine: >1.03 — ABNORMAL HIGH (ref 1.005–1.030)
pH: 5.5 (ref 5.0–8.0)

## 2020-01-26 LAB — WET PREP, GENITAL
Sperm: NONE SEEN
Trich, Wet Prep: NONE SEEN
Yeast Wet Prep HPF POC: NONE SEEN

## 2020-01-26 LAB — COMPREHENSIVE METABOLIC PANEL
ALT: 16 U/L (ref 0–44)
AST: 15 U/L (ref 15–41)
Albumin: 4.1 g/dL (ref 3.5–5.0)
Alkaline Phosphatase: 46 U/L (ref 38–126)
Anion gap: 9 (ref 5–15)
BUN: 9 mg/dL (ref 6–20)
CO2: 27 mmol/L (ref 22–32)
Calcium: 8.7 mg/dL — ABNORMAL LOW (ref 8.9–10.3)
Chloride: 103 mmol/L (ref 98–111)
Creatinine, Ser: 0.79 mg/dL (ref 0.44–1.00)
GFR calc Af Amer: >60 mL/min (ref >60)
GFR calc non Af Amer: >60 mL/min (ref >60)
Glucose, Bld: 94 mg/dL (ref 70–99)
Potassium: 3.8 mmol/L (ref 3.5–5.1)
Sodium: 139 mmol/L (ref 135–145)
Total Bilirubin: 0.7 mg/dL (ref 0.3–1.2)
Total Protein: 6.8 g/dL (ref 6.5–8.1)

## 2020-01-26 LAB — LIPASE, BLOOD: Lipase: 24 U/L (ref 11–51)

## 2020-01-26 LAB — PREGNANCY, URINE: Preg Test, Ur: NEGATIVE

## 2020-01-26 MED ORDER — DOXYCYCLINE HYCLATE 100 MG PO TABS
100.0000 mg | ORAL_TABLET | Freq: Once | ORAL | Status: AC
  Administered 2020-01-26: 100 mg via ORAL
  Filled 2020-01-26: qty 1

## 2020-01-26 MED ORDER — IOHEXOL 300 MG/ML  SOLN
100.0000 mL | Freq: Once | INTRAMUSCULAR | Status: AC | PRN
  Administered 2020-01-26: 100 mL via INTRAVENOUS

## 2020-01-26 MED ORDER — KETOROLAC TROMETHAMINE 30 MG/ML IJ SOLN
30.0000 mg | Freq: Once | INTRAMUSCULAR | Status: AC
  Administered 2020-01-26: 30 mg via INTRAVENOUS
  Filled 2020-01-26: qty 1

## 2020-01-26 MED ORDER — DOXYCYCLINE HYCLATE 100 MG PO CAPS: 100.0000 mg | ORAL_CAPSULE | Freq: Two times a day (BID) | ORAL | 0 refills | Status: AC

## 2020-01-26 MED ORDER — CEFTRIAXONE SODIUM 500 MG IJ SOLR
500.0000 mg | Freq: Once | INTRAMUSCULAR | Status: AC
  Administered 2020-01-26: 500 mg via INTRAMUSCULAR
  Filled 2020-01-26: qty 500

## 2020-01-26 MED ORDER — METRONIDAZOLE 500 MG PO TABS: 500.0000 mg | ORAL_TABLET | Freq: Two times a day (BID) | ORAL | 0 refills | Status: DC

## 2020-01-26 NOTE — Discharge Instructions (Signed)
Take antibiotics as directed. Please take all of your antibiotics until finished.  Take Flagyl as directed.  It is very important that you do not consume any alcohol while taking this medication as it will cause you to become violently ill.  As we discussed, your imaging did show a left ovarian cyst.  This could be contributing to your pain.  You can take Tylenol or ibuprofen for pain as needed.  You can take Tylenol or Ibuprofen as directed for pain. You can alternate Tylenol and Ibuprofen every 4 hours. If you take Tylenol at 1pm, then you can take Ibuprofen at 5pm. Then you can take Tylenol again at 9pm.   Follow-up with OB/GYN.  Return the emergency department for any worsening pain, fever, vomiting or any other worsening or concerning symptoms.

## 2020-01-26 NOTE — ED Triage Notes (Signed)
Pt states she has been having vaginal pain and pressure for the past 3 to 4 days  Tonight she was using the restroom and felt something pop and she passed a clear redish clot about the size of a quarter  Pt states she has had some vaginal spotting  Las period ended last Friday

## 2020-01-26 NOTE — ED Provider Notes (Signed)
[facility] EMERGENCY DEPARTMENT Provider Note   CSN: [id number] Arrival date & time: 01/26/20  2046     History Chief Complaint  Patient presents with   Vaginal Pain    Molly Mcknight is a 34 y.o. female possible history of anemia, chlamydia, PID, Trichomonas presents for evaluation of 3 days of lower abdominal pain, vaginal pain, pressure.  She reports that today, she was at work and was using the bathroom.  She reports she was having an episode of sharp lower abdominal pain and was folded over.  She states she felt something pop and then she passed a clear, reddish clot about the size of a quarter.  She reports she has had some intermittent vaginal spotting but currently denies any vaginal bleeding.  Her last LMP was about a week ago.  She states she has had a little bit of dysuria but no hematuria.  She has had some nausea but denies any vomiting, diarrhea.  Her last bowel movement was yesterday and was normal.  She denies any fevers, chest pain, difficulty breathing.  She is currently sexually active with 1 partner.  They do not use protection.  The history is provided by the patient.       Past Medical History:  Diagnosis Date   Anemia    Chlamydia    PID (pelvic inflammatory disease)    Trichimoniasis     There are no problems to display for this patient.   Past Surgical History:  Procedure Laterality Date   AMPUTATION FINGER Right    CHOLECYSTECTOMY     TUBAL LIGATION       OB History    Gravida  3   Para  1   Term  1   Preterm  0   AB  1   Living  1     SAB  1   TAB  0   Ectopic  0   Multiple  0   Live Births  1           Family History  Problem Relation Age of Onset   Hypertension Mother    Diabetes Mother    Stroke Other    Diabetes Other    Hyperlipidemia Other     Social History   Tobacco Use   Smoking status: Former Smoker    Packs/day: 0.50   Smokeless tobacco: Never Used  Substance Use Topics     Alcohol use: Not Currently    Comment: occ   Drug use: No    Home Medications Prior to Admission medications   Medication Sig Start Date End Date Taking? Authorizing Provider  Acetaminophen (TYLENOL 8 HOUR PO) Take by mouth.    [provider]  amoxicillin (AMOXIL) 500 MG capsule Take 1 capsule (500 mg total) by mouth 3 (three) times daily. 09/10/19   Tilden Fossa, MD  doxycycline (VIBRAMYCIN) 100 MG capsule Take 1 capsule (100 mg total) by mouth 2 (two) times daily for 7 days. 01/26/20 02/02/20  Maxwell Caul, PA-C  fluticasone (FLONASE) 50 MCG/ACT nasal spray Place 1 spray into both nostrils daily. 09/10/19   Tilden Fossa, MD  HYDROcodone-acetaminophen (NORCO/VICODIN) 5-325 MG tablet Take 1 tablet by mouth every 4 (four) hours as needed. 08/22/19   Tegeler, Canary Brim, MD  metroNIDAZOLE (FLAGYL) 500 MG tablet Take 1 tablet (500 mg total) by mouth 2 (two) times daily. 01/26/20   Maxwell Caul, PA-C  metroNIDAZOLE (METROGEL VAGINAL) 0.75 % vaginal gel Place 1 Applicatorful  vaginally 2 (two) times daily. For flare up of bacterial vaginosis 01/03/19   Gwyneth Sprout, MD  naproxen (NAPROSYN) 500 MG tablet Take 1 tablet (500 mg total) by mouth 2 (two) times daily. 11/12/16   Gwyneth Sprout, MD  ondansetron (ZOFRAN) 4 MG tablet Take 1 tablet (4 mg total) by mouth every 8 (eight) hours as needed for nausea or vomiting. 08/22/19   Tegeler, Canary Brim, MD  polyethylene glycol powder (GLYCOLAX/MIRALAX) powder Take 17 g by mouth daily. Please take until you are having soft bowel movements daily.  17 g is 1 capful. 12/15/17   Aviva Kluver B, PA-C    Allergies    Patient has no known allergies.  Review of Systems   Review of Systems  Constitutional: Negative for fever.  Respiratory: Negative for cough and shortness of breath.   Cardiovascular: Negative for chest pain.  Gastrointestinal: Positive for abdominal pain and nausea. Negative for vomiting.  Genitourinary:  Positive for dysuria and vaginal bleeding. Negative for hematuria.  Neurological: Negative for headaches.  All other systems reviewed and are negative.   Physical Exam Updated Vital Signs BP 110/78 (BP Location: Right Arm)    Pulse (!) 57    Temp 98.1 F (36.7 C) (Oral)    Resp 16    Ht 5\' 1"  (1.549 m)    Wt 97.5 kg    LMP 01/12/2020    SpO2 100%    BMI 40.62 kg/m   Physical Exam Vitals and nursing note reviewed. Exam conducted with a chaperone present.  Constitutional:      Appearance: Normal appearance. She is well-developed.     Comments: Sitting comfortably on examination table  HENT:     Head: Normocephalic and atraumatic.  Eyes:     General: Lids are normal.     Conjunctiva/sclera: Conjunctivae normal.     Pupils: Pupils are equal, round, and reactive to light.  Cardiovascular:     Rate and Rhythm: Normal rate and regular rhythm.     Pulses: Normal pulses.     Heart sounds: Normal heart sounds. No murmur. No friction rub. No gallop.   Pulmonary:     Effort: Pulmonary effort is normal.     Breath sounds: Normal breath sounds.  Abdominal:     Palpations: Abdomen is soft. Abdomen is not rigid.     Tenderness: There is abdominal tenderness in the right lower quadrant, suprapubic area and left lower quadrant. There is no right CVA tenderness, left CVA tenderness or guarding.     Comments: Abdomen soft, nondistended.  Tenderness palpation lower abdomen diffusely with no focal point.  No rigidity, guarding.  No CVA tenderness noted bilaterally.  Genitourinary:    Vagina: Normal.     Cervix: Cervical motion tenderness present. No friability or erythema.     Adnexa:        Right: Tenderness present. No mass.         Left: Tenderness present. No mass.       Comments: The exam was performed with a chaperone present. Normal external female genitalia. No lesions, rash, or sores.  No vaginal bleeding noted.  No hemorrhage in the vaginal canal.  Cervix is without any friability,  erythema.  She does have some mild CMT as well as some bilateral adnexal tenderness. Musculoskeletal:        General: Normal range of motion.     Cervical back: Full passive range of motion without pain.  Skin:    General: Skin is warm  and dry.     Capillary Refill: Capillary refill takes less than 2 seconds.  Neurological:     Mental Status: She is alert and oriented to person, place, and time.  Psychiatric:        Speech: Speech normal.     ED Results / Procedures / Treatments   Labs (all labs ordered are listed, but only abnormal results are displayed) Labs Reviewed  WET PREP, GENITAL - Abnormal; Notable for the following components:      Result Value   Clue Cells Wet Prep HPF POC PRESENT (*)    WBC, Wet Prep HPF POC FEW (*)    All other components within normal limits  URINALYSIS, ROUTINE W REFLEX MICROSCOPIC - Abnormal; Notable for the following components:   APPearance CLOUDY (*)    Specific Gravity, Urine >1.030 (*)    All other components within normal limits  COMPREHENSIVE METABOLIC PANEL - Abnormal; Notable for the following components:   Calcium 8.7 (*)    All other components within normal limits  PREGNANCY, URINE  CBC WITH DIFFERENTIAL/PLATELET  LIPASE, BLOOD  GC/CHLAMYDIA PROBE AMP ([place]) NOT AT [facility]    EKG None  Radiology CT ABDOMEN PELVIS W CONTRAST  Result Date: 01/26/2020 CLINICAL DATA:  Vaginal pain and pressure for 3-4 days. EXAM: CT ABDOMEN AND PELVIS WITH CONTRAST TECHNIQUE: Multidetector CT imaging of the abdomen and pelvis was performed using the standard protocol following bolus administration of intravenous contrast. CONTRAST:  148mL OMNIPAQUE IOHEXOL 300 MG/ML  SOLN COMPARISON:  August 02, 2019 FINDINGS: Lower chest: No acute abnormality. Hepatobiliary: No focal liver abnormality is seen. Status post cholecystectomy. No biliary dilatation. Pancreas: Unremarkable. No pancreatic ductal dilatation or surrounding inflammatory changes. Spleen:  Normal in size without focal abnormality. Adrenals/Urinary Tract: Adrenal glands are unremarkable. Kidneys are normal in size, focal lesions or hydronephrosis. 2 mm nonobstructing renal stones are seen within the lower pole of the right kidney. These are seen on the prior study. Bladder is unremarkable. Stomach/Bowel: Stomach is within normal limits. Appendix appears normal. No evidence of bowel wall thickening, distention, or inflammatory changes. Vascular/Lymphatic: No significant vascular findings are present. No enlarged abdominal or pelvic lymph nodes. Reproductive: The uterus is normal in appearance. A 2.2 cm cyst is seen along the left adnexa. Other: No abdominal wall hernia or abnormality. No abdominopelvic ascites. Musculoskeletal: No acute or significant osseous findings. IMPRESSION: 1. 2 mm nonobstructing renal stones within the lower pole of the right kidney. 2. 2.2 cm left adnexal cyst, likely ovarian in origin. Electronically Signed   By: Virgina Norfolk M.D.   On: 01/26/2020 22:48    Procedures Procedures (including critical care time)  Medications Ordered in ED Medications  cefTRIAXone (ROCEPHIN) injection 500 mg (has no administration in time range)  doxycycline (VIBRA-TABS) tablet 100 mg (has no administration in time range)  ketorolac (TORADOL) 30 MG/ML injection 30 mg (30 mg Intravenous Given 01/26/20 2219)  iohexol (OMNIPAQUE) 300 MG/ML solution 100 mL (100 mLs Intravenous Contrast Given 01/26/20 2235)    ED Course  I have reviewed the triage vital signs and the nursing notes.  Pertinent labs & imaging results that were available during my care of the patient were reviewed by me and considered in my medical decision making (see chart for details).    MDM Rules/Calculators/A&P                      34 year old female who presents for evaluation of 3 days of lower  abdominal pain as well as some vaginal pain and pressure.  Today felt like she was in the bathroom and had a pop  sensation in her lower abdomen followed by passing a large, clear, circular reddish clot the size of a quarter.  She is currently sexually active with 1 partner.  They do not use protection.  Initially arrival, she is afebrile, nontoxic-appearing.  Vital signs are stable.  On exam, she has some lower abdominal tenderness diffusely.  No focal point tenderness noted be concerning for appendicitis.  History/physical exam not concerning for ovarian torsion.  Consider PID.  Plan for labs, pelvic.  Pelvic as documented above.  She did have some mild CMT as well as bilateral adnexal tenderness.  No adnexal mass noted.  No vaginal bleeding.  No evidence of bleeding in the vaginal vault.  Given adnexal tenderness and bilaterally, will obtain imaging.  Unfortunate this time, we do not have ultrasound capability at this facility.  At this time, her exam is not concerning for ovarian torsion.  She appears comfortable and is hemodynamically stable.  CBC shows no leukocytosis or anemia.  CMP is unremarkable.  Lipase is normal.  UA negative for any infection etiology.  Urine pregnancy negative.  Wet prep positive for clue cells.  CT scan shows 2 mm nonobstructing renal stone in the lower pole of the right kidney.  There is a 2.2 cm left adnexal cyst likely ovarian in origin.  Discussed results with patient.  Patient is sitting on bed resting comfortably.  She reports some improvement in pain.  Suspect that the ovarian cyst is likely contributing to her pain.  Additionally, given the CMT, will plan for treatment for PID.  Patient with no known drug allergies.  Will give patient outpatient OB/GYN referral. At this time, patient exhibits no emergent life-threatening condition that require further evaluation in ED or admission. Patient had ample opportunity for questions and discussion. All patient's questions were answered with full understanding. Strict return precautions discussed. Patient expresses understanding and  agreement to plan.   Portions of this note were generated with Scientist, clinical (histocompatibility and immunogenetics). Dictation errors may occur despite best attempts at proofreading.   Final Clinical Impression(s) / ED Diagnoses Final diagnoses:  Pelvic inflammatory disease  Left ovarian cyst    Rx / DC Orders ED Discharge Orders         Ordered    doxycycline (VIBRAMYCIN) 100 MG capsule  2 times daily     01/26/20 2334    metroNIDAZOLE (FLAGYL) 500 MG tablet  2 times daily     01/26/20 2334           Maxwell Caul, PA-C 01/26/20 2347    Little, Ambrose Finland, MD 01/26/20 2355

## 2020-01-29 LAB — GC/CHLAMYDIA PROBE AMP (~~LOC~~) NOT AT ARMC
Chlamydia: POSITIVE — AB
Comment: NEGATIVE
Comment: NORMAL
Neisseria Gonorrhea: NEGATIVE

## 2020-03-21 ENCOUNTER — Encounter (HOSPITAL_BASED_OUTPATIENT_CLINIC_OR_DEPARTMENT_OTHER): Payer: Self-pay

## 2020-03-21 ENCOUNTER — Other Ambulatory Visit: Payer: Self-pay

## 2020-03-21 ENCOUNTER — Emergency Department (HOSPITAL_BASED_OUTPATIENT_CLINIC_OR_DEPARTMENT_OTHER)
Admission: EM | Admit: 2020-03-21 | Discharge: 2020-03-21 | Disposition: A | Discharge status: Home / Self Care | Payer: Self-pay | Attending: Emergency Medicine | Admitting: Emergency Medicine

## 2020-03-21 DIAGNOSIS — N3001 Acute cystitis with hematuria: Secondary | ICD-10-CM | POA: Insufficient documentation

## 2020-03-21 DIAGNOSIS — Z79899 Other long term (current) drug therapy: Secondary | ICD-10-CM | POA: Insufficient documentation

## 2020-03-21 DIAGNOSIS — F17200 Nicotine dependence, unspecified, uncomplicated: Secondary | ICD-10-CM | POA: Insufficient documentation

## 2020-03-21 LAB — URINALYSIS, ROUTINE W REFLEX MICROSCOPIC
Bilirubin Urine: NEGATIVE
Glucose, UA: NEGATIVE mg/dL
Ketones, ur: NEGATIVE mg/dL
Nitrite: NEGATIVE
Protein, ur: NEGATIVE mg/dL
Specific Gravity, Urine: 1.025 (ref 1.005–1.030)
pH: 6 (ref 5.0–8.0)

## 2020-03-21 LAB — PREGNANCY, URINE: Preg Test, Ur: NEGATIVE

## 2020-03-21 LAB — URINALYSIS, MICROSCOPIC (REFLEX)

## 2020-03-21 MED ORDER — FLUCONAZOLE 150 MG PO TABS
ORAL_TABLET | ORAL | 0 refills | Status: DC
Start: 2020-03-21 — End: 2020-12-24

## 2020-03-21 MED ORDER — CEPHALEXIN 500 MG PO CAPS
500.0000 mg | ORAL_CAPSULE | Freq: Two times a day (BID) | ORAL | 0 refills | Status: DC
Start: 2020-03-21 — End: 2020-12-24

## 2020-03-21 MED ORDER — PHENAZOPYRIDINE HCL 200 MG PO TABS: 200.0000 mg | ORAL_TABLET | Freq: Three times a day (TID) | ORAL | 0 refills | Status: DC | PRN

## 2020-03-21 MED ORDER — CEPHALEXIN 250 MG PO CAPS
500.0000 mg | ORAL_CAPSULE | Freq: Once | ORAL | Status: AC
  Administered 2020-03-21: 500 mg via ORAL
  Filled 2020-03-21: qty 2

## 2020-03-21 MED ORDER — PHENAZOPYRIDINE HCL 100 MG PO TABS
200.0000 mg | ORAL_TABLET | Freq: Once | ORAL | Status: AC
  Administered 2020-03-21: 200 mg via ORAL
  Filled 2020-03-21: qty 2

## 2020-03-21 NOTE — ED Provider Notes (Signed)
[facility] Provider Note: Molly Dell, MD, FACEP  CSN: [id number] MRN: [id number] ARRIVAL: 03/21/20 at 2052 ROOM: [facility]10/[facility]10   CHIEF COMPLAINT  Hematuria   HISTORY OF PRESENT ILLNESS  03/21/20 10:50 PM Molly Mcknight is a 33 y.o. female with hematuria, urinary frequency and urinary urgency that began yesterday.  She rates associated pain as an 8 out of 10, located in her vagina, and describes it as constant and describes it as a persistent feeling that she needs to empty her bladder despite having done so.  Pain is worse with urination but she denies burning with urination.  She has had no vaginal bleeding or discharge.  She has had no fever, chills, nausea, vomiting or diarrhea.  She has had no back pain.   Past Medical History:  Diagnosis Date  . Anemia   . Chlamydia   . PID (pelvic inflammatory disease)   . Trichimoniasis     Past Surgical History:  Procedure Laterality Date  . AMPUTATION FINGER Right   . CHOLECYSTECTOMY    . TUBAL LIGATION      Family History  Problem Relation Age of Onset  . Hypertension Mother   . Diabetes Mother   . Stroke Other   . Diabetes Other   . Hyperlipidemia Other     Social History   Tobacco Use  . Smoking status: Current Every Day Smoker    Packs/day: 0.50  . Smokeless tobacco: Never Used  Vaping Use  . Vaping Use: Former  Substance Use Topics  . Alcohol use: Not Currently  . Drug use: No    Prior to Admission medications   Medication Sig Start Date End Date Taking? Authorizing Provider  cephALEXin (KEFLEX) 500 MG capsule Take 1 capsule (500 mg total) by mouth 2 (two) times daily. 03/21/20   Malika Demario, MD  fluconazole (DIFLUCAN) 150 MG tablet Take 1 tablet as needed for vaginal yeast infection.  May repeat in 3 days if symptoms persist. 03/21/20   Ameyah Bangura, Jonny Ruiz, MD  phenazopyridine (PYRIDIUM) 200 MG tablet Take 1 tablet (200 mg total) by mouth 3 (three) times daily as needed for pain. 03/21/20   Anacleto Batterman,  MD  fluticasone (FLONASE) 50 MCG/ACT nasal spray Place 1 spray into both nostrils daily. 09/10/19 03/21/20  Tilden Fossa, MD    Allergies Patient has no known allergies.   REVIEW OF SYSTEMS  Negative except as noted here or in the History of Present Illness.   PHYSICAL EXAMINATION  Initial Vital Signs Blood pressure (!) 130/84, pulse 75, temperature 98.1 F (36.7 C), temperature source Oral, resp. rate 18, last menstrual period 03/13/2020, SpO2 100 %.  Examination General: Well-developed, well-nourished female in no acute distress; appearance consistent with age of record HENT: normocephalic; atraumatic Eyes: pupils equal, round and reactive to light; extraocular muscles intact Neck: supple Heart: regular rate and rhythm Lungs: clear to auscultation bilaterally Abdomen: soft; nondistended; mild suprapubic tenderness; bowel sounds present GU: No CVA tenderness Extremities: No deformity; full range of motion; pulses normal Neurologic: Awake, alert and oriented; motor function intact in all extremities and symmetric; no facial droop Skin: Warm and dry Psychiatric: Normal mood and affect   RESULTS  Summary of this visit's results, reviewed and interpreted by myself:   EKG Interpretation  Date/Time:    Ventricular Rate:    PR Interval:    QRS Duration:   QT Interval:    QTC Calculation:   R Axis:     Text Interpretation:  Laboratory Studies: Results for orders placed or performed during the hospital encounter of 03/21/20 (from the past 24 hour(s))  Urinalysis, Routine w reflex microscopic     Status: Abnormal   Collection Time: 03/21/20 10:31 PM  Result Value Ref Range   Color, Urine YELLOW YELLOW   APPearance CLOUDY (A) CLEAR   Specific Gravity, Urine 1.025 1.005 - 1.030   pH 6.0 5.0 - 8.0   Glucose, UA NEGATIVE NEGATIVE mg/dL   Hgb urine dipstick MODERATE (A) NEGATIVE   Bilirubin Urine NEGATIVE NEGATIVE   Ketones, ur NEGATIVE NEGATIVE mg/dL   Protein,  ur NEGATIVE NEGATIVE mg/dL   Nitrite NEGATIVE NEGATIVE   Leukocytes,Ua MODERATE (A) NEGATIVE  Pregnancy, urine     Status: None   Collection Time: 03/21/20 10:31 PM  Result Value Ref Range   Preg Test, Ur NEGATIVE NEGATIVE  Urinalysis, Microscopic (reflex)     Status: Abnormal   Collection Time: 03/21/20 10:31 PM  Result Value Ref Range   RBC / HPF 11-20 0 - 5 RBC/hpf   WBC, UA 21-50 0 - 5 WBC/hpf   Bacteria, UA FEW (A) NONE SEEN   Squamous Epithelial / LPF 6-10 0 - 5   Imaging Studies: No results found.  ED COURSE and MDM  Nursing notes, initial and subsequent vitals signs, including pulse oximetry, reviewed and interpreted by myself.  Vitals:   03/21/20 2120  BP: (!) 130/84  Pulse: 75  Resp: 18  Temp: 98.1 F (36.7 C)  TempSrc: Oral  SpO2: 100%   Medications  cephALEXin (KEFLEX) capsule 500 mg (has no administration in time range)  phenazopyridine (PYRIDIUM) tablet 200 mg (has no administration in time range)    Presentation consistent with urinary tract infection with hematuria.  PROCEDURES  Procedures   ED DIAGNOSES     ICD-10-CM   1. Acute cystitis with hematuria  N30.01        Dannika Hilgeman, Jonny Ruiz, MD 03/21/20 2302

## 2020-03-21 NOTE — ED Triage Notes (Signed)
Pt c/o hematuria, urinary freq/urgency started yesterday-NAD-steady gait

## 2020-03-24 LAB — URINE CULTURE: Culture: >=100000 — AB

## 2020-03-25 ENCOUNTER — Telehealth: Payer: Self-pay

## 2020-03-25 NOTE — Telephone Encounter (Signed)
Post ED Visit - Positive Culture Follow-up  Culture report reviewed by antimicrobial stewardship pharmacist: [facility] []  , Pharm.D. [x]  Enzo Bi, Pharm.D., BCPS AQ-ID []  , Pharm.D., BCPS []  Celedonio Miyamoto, .D., BCPS []  [place], .D., BCPS, AAHIVP []  Georgina Pillion, Pharm.D., BCPS, AAHIVP []  [address], PharmD, BCPS []  , PharmD, BCPS []  [place], PharmD, BCPS []  [address], PharmD []  , PharmD, BCPS []  Estella Husk, PharmD  Pharmacy Team []  Lysle Pearl, PharmD []  , PharmD []  Phillips Climes, PharmD []  , Rph []  Agapito Games) , PharmD []  Verlan Friends, PharmD []  , PharmD []  Mervyn Gay, PharmD []  , PharmD []  Vinnie Level, PharmD []  [facility], PharmD []  , PharmD []  Len Childs, PharmD   Positive urine culture Treated with Cephalexin, organism sensitive to the same and no further patient follow-up is required at this time.  03/25/2020, 8:54 AM

## 2020-05-21 ENCOUNTER — Emergency Department (HOSPITAL_BASED_OUTPATIENT_CLINIC_OR_DEPARTMENT_OTHER)
Admission: EM | Admit: 2020-05-21 | Discharge: 2020-05-21 | Disposition: A | Discharge status: Home / Self Care | Payer: Self-pay | Attending: Emergency Medicine | Admitting: Emergency Medicine

## 2020-05-21 ENCOUNTER — Other Ambulatory Visit: Payer: Self-pay

## 2020-05-21 ENCOUNTER — Encounter (HOSPITAL_BASED_OUTPATIENT_CLINIC_OR_DEPARTMENT_OTHER): Payer: Self-pay | Admitting: *Deleted

## 2020-05-21 DIAGNOSIS — N730 Acute parametritis and pelvic cellulitis: Secondary | ICD-10-CM

## 2020-05-21 DIAGNOSIS — F17210 Nicotine dependence, cigarettes, uncomplicated: Secondary | ICD-10-CM | POA: Insufficient documentation

## 2020-05-21 DIAGNOSIS — A599 Trichomoniasis, unspecified: Secondary | ICD-10-CM | POA: Insufficient documentation

## 2020-05-21 LAB — URINALYSIS, ROUTINE W REFLEX MICROSCOPIC
Bilirubin Urine: NEGATIVE
Glucose, UA: NEGATIVE mg/dL
Hgb urine dipstick: NEGATIVE
Ketones, ur: NEGATIVE mg/dL
Nitrite: NEGATIVE
Protein, ur: NEGATIVE mg/dL
Specific Gravity, Urine: 1.02 (ref 1.005–1.030)
pH: 6.5 (ref 5.0–8.0)

## 2020-05-21 LAB — URINALYSIS, MICROSCOPIC (REFLEX)

## 2020-05-21 LAB — HIV ANTIBODY (ROUTINE TESTING W REFLEX): HIV Screen 4th Generation wRfx: NONREACTIVE

## 2020-05-21 LAB — WET PREP, GENITAL
Clue Cells Wet Prep HPF POC: NONE SEEN
Sperm: NONE SEEN
Yeast Wet Prep HPF POC: NONE SEEN

## 2020-05-21 LAB — PREGNANCY, URINE: Preg Test, Ur: NEGATIVE

## 2020-05-21 MED ORDER — DOXYCYCLINE HYCLATE 100 MG PO CAPS
100.0000 mg | ORAL_CAPSULE | Freq: Two times a day (BID) | ORAL | 0 refills | Status: DC
Start: 2020-05-21 — End: 2020-11-08

## 2020-05-21 MED ORDER — HYDROCODONE-ACETAMINOPHEN 5-325 MG PO TABS: 1.0000 | ORAL_TABLET | Freq: Once | ORAL | Status: DC

## 2020-05-21 MED ORDER — ONDANSETRON 4 MG PO TBDP
4.0000 mg | ORAL_TABLET | Freq: Three times a day (TID) | ORAL | 0 refills | Status: DC | PRN
Start: 2020-05-21 — End: 2020-12-24

## 2020-05-21 MED ORDER — CEFTRIAXONE SODIUM 500 MG IJ SOLR
500.0000 mg | Freq: Once | INTRAMUSCULAR | Status: AC
  Administered 2020-05-21: 500 mg via INTRAMUSCULAR
  Filled 2020-05-21: qty 500

## 2020-05-21 MED ORDER — KETOROLAC TROMETHAMINE 30 MG/ML IJ SOLN
30.0000 mg | Freq: Once | INTRAMUSCULAR | Status: AC
  Administered 2020-05-21: 30 mg via INTRAVENOUS
  Filled 2020-05-21: qty 1

## 2020-05-21 MED ORDER — ONDANSETRON 4 MG PO TBDP
4.0000 mg | ORAL_TABLET | Freq: Once | ORAL | Status: AC
  Administered 2020-05-21: 4 mg via ORAL
  Filled 2020-05-21: qty 1

## 2020-05-21 MED ORDER — METRONIDAZOLE 500 MG PO TABS
500.0000 mg | ORAL_TABLET | Freq: Two times a day (BID) | ORAL | 0 refills | Status: DC
Start: 2020-05-21 — End: 2020-11-08

## 2020-05-21 MED ORDER — DOXYCYCLINE HYCLATE 100 MG PO TABS
100.0000 mg | ORAL_TABLET | Freq: Once | ORAL | Status: AC
  Administered 2020-05-21: 100 mg via ORAL
  Filled 2020-05-21: qty 1

## 2020-05-21 MED ORDER — LIDOCAINE HCL (PF) 1 % IJ SOLN
INTRAMUSCULAR | Status: AC
  Administered 2020-05-21: 1 mL
  Filled 2020-05-21: qty 5

## 2020-05-21 MED ORDER — METRONIDAZOLE 500 MG PO TABS
500.0000 mg | ORAL_TABLET | Freq: Once | ORAL | Status: AC
  Administered 2020-05-21: 500 mg via ORAL
  Filled 2020-05-21: qty 1

## 2020-05-21 NOTE — ED Triage Notes (Signed)
C/o vaginal discharge x 3 days nausea x 2 days

## 2020-05-21 NOTE — Discharge Instructions (Signed)
One of your STD test is important that you notify your partner so that they may be treated.  Your gonorrhea, syphilis, HIV chlamydia test is pending.  Your exam today was consistent with pelvic inflammatory disease.  Take the antibiotics as prescribed, Flagyl and doxycycline.  You cannot drink alcohol while taking this medication as this may make you sick.  I have written you prescription for Zofran to help with nausea.  I suggest taking Tylenol and ibuprofen as needed for pain.  You should follow-up with an OB/GYN to establish confirmation of cure for the trichomonas.  There is a [facility] for [facility]: Listed in your discharge paperwork.  You will need to call to schedule appointment.  Return for any worsening symptoms.

## 2020-05-21 NOTE — ED Notes (Signed)
ED Provider at bedside. 

## 2020-05-21 NOTE — ED Provider Notes (Signed)
[facility] EMERGENCY DEPARTMENT Provider Note   CSN: [id number] Arrival date & time: 05/21/20  1428    History Chief Complaint  Patient presents with  . Vaginal Discharge   Molly Mcknight is a 34 y.o. female with past medical history significant for chlamydia, PID, trichomonas who presents for evaluation of vaginal discharge.  Patient states she has had yellow/green vaginal discharge over the last 3 days.  She is sexually active and does not use protection.  Last intercourse 2 weeks ago.  Will occasionally have lower abdominal cramping.  She has some burning when she urinates and vaginal pruritus. No pain to RLQ or LLQ.    No labial pain. Nausea without emesis. Hx tubal ligation.  No additional aggravating or alleviating factors.  History obtained from patient and past medical records. No interpretor was used.   HPI     Past Medical History:  Diagnosis Date  . Anemia   . Chlamydia   . PID (pelvic inflammatory disease)   . Trichimoniasis     There are no problems to display for this patient.   Past Surgical History:  Procedure Laterality Date  . AMPUTATION FINGER Right   . CHOLECYSTECTOMY    . TUBAL LIGATION       OB History    Gravida  3   Para  1   Term  1   Preterm  0   AB  1   Living  1     SAB  1   TAB  0   Ectopic  0   Multiple  0   Live Births  1           Family History  Problem Relation Age of Onset  . Hypertension Mother   . Diabetes Mother   . Stroke Other   . Diabetes Other   . Hyperlipidemia Other     Social History   Tobacco Use  . Smoking status: Current Every Day Smoker    Packs/day: 0.50  . Smokeless tobacco: Never Used  Vaping Use  . Vaping Use: Former  Substance Use Topics  . Alcohol use: Not Currently  . Drug use: No    Home Medications Prior to Admission medications   Medication Sig Start Date End Date Taking? Authorizing Provider  cephALEXin (KEFLEX) 500 MG capsule Take 1 capsule (500 mg  total) by mouth 2 (two) times daily. 03/21/20   Molpus, John, MD  doxycycline (VIBRAMYCIN) 100 MG capsule Take 1 capsule (100 mg total) by mouth 2 (two) times daily. 05/21/20   Amdrew Oboyle A, PA-C  fluconazole (DIFLUCAN) 150 MG tablet Take 1 tablet as needed for vaginal yeast infection.  May repeat in 3 days if symptoms persist. 03/21/20   Molpus, Jonny Ruiz, MD  metroNIDAZOLE (FLAGYL) 500 MG tablet Take 1 tablet (500 mg total) by mouth 2 (two) times daily. 05/21/20   Shamica Moree A, PA-C  ondansetron (ZOFRAN ODT) 4 MG disintegrating tablet Take 1 tablet (4 mg total) by mouth every 8 (eight) hours as needed for nausea or vomiting. 05/21/20   Lenni Reckner A, PA-C  phenazopyridine (PYRIDIUM) 200 MG tablet Take 1 tablet (200 mg total) by mouth 3 (three) times daily as needed for pain. 03/21/20   Molpus, John, MD  fluticasone (FLONASE) 50 MCG/ACT nasal spray Place 1 spray into both nostrils daily. 09/10/19 03/21/20  Tilden Fossa, MD    Allergies    Patient has no known allergies.  Review of Systems   Review of Systems  Constitutional: Negative.   HENT: Negative.   Respiratory: Negative.   Cardiovascular: Negative.   Gastrointestinal: Positive for abdominal pain and nausea. Negative for abdominal distention, anal bleeding, blood in stool, constipation, diarrhea, rectal pain and vomiting.  Genitourinary: Positive for dysuria and vaginal discharge. Negative for decreased urine volume, difficulty urinating, flank pain, frequency, genital sores, hematuria, menstrual problem, pelvic pain, urgency, vaginal bleeding and vaginal pain.  Musculoskeletal: Negative.   Skin: Negative.   Neurological: Negative.   All other systems reviewed and are negative.   Physical Exam Updated Vital Signs BP 126/80   Pulse 69   Temp 98.1 F (36.7 C)   Resp 18   Ht 5\' 1"  (1.549 m)   Wt 90.7 kg   LMP 05/07/2020   SpO2 100%   BMI 37.79 kg/m   Physical Exam Vitals and nursing note reviewed. Exam conducted  with a chaperone present.  Constitutional:      General: She is not in acute distress.    Appearance: She is well-developed. She is not ill-appearing, toxic-appearing or diaphoretic.  HENT:     Head: Normocephalic and atraumatic.     Nose: Nose normal.     Mouth/Throat:     Mouth: Mucous membranes are moist.  Eyes:     Pupils: Pupils are equal, round, and reactive to light.  Cardiovascular:     Rate and Rhythm: Normal rate.     Pulses: Normal pulses.     Heart sounds: Normal heart sounds.  Pulmonary:     Effort: Pulmonary effort is normal. No respiratory distress.     Breath sounds: Normal breath sounds.  Abdominal:     General: Bowel sounds are normal. There is no distension.     Palpations: There is no mass.     Tenderness: There is no abdominal tenderness. There is no right CVA tenderness, left CVA tenderness, guarding or rebound. Negative signs include Murphy's sign and McBurney's sign.     Hernia: No hernia is present.  Genitourinary:    Comments: Normal appearing external female genitalia without rashes or lesions, normal vaginal epithelium. Normal appearing cervix with moderate yellow/green discharge. No cervical petechiae. Cervical os is closed. There is no bleeding noted at the os. Mild odor. Bimanual: Moderate CMT,  Non-tender adnexa.  No palpable adnexal masses or tenderness. Uterus midline and not fixed. Rectovaginal exam was deferred.  No cystocele or rectocele noted. No pelvic lymphadenopathy noted. Wet prep was obtained.  Cultures for gonorrhea and chlamydia collected. Exam performed with chaperone in room. Musculoskeletal:        General: Normal range of motion.     Cervical back: Normal range of motion.  Skin:    General: Skin is warm and dry.     Capillary Refill: Capillary refill takes less than 2 seconds.     Comments: No rashes or lesions.  Neurological:     Mental Status: She is alert.    ED Results / Procedures / Treatments   Labs (all labs ordered are  listed, but only abnormal results are displayed) Labs Reviewed  WET PREP, GENITAL - Abnormal; Notable for the following components:      Result Value   Trich, Wet Prep PRESENT (*)    WBC, Wet Prep HPF POC MANY (*)    All other components within normal limits  URINALYSIS, ROUTINE W REFLEX MICROSCOPIC - Abnormal; Notable for the following components:   Leukocytes,Ua LARGE (*)    All other components within normal limits  URINALYSIS, MICROSCOPIC (REFLEX) -  Abnormal; Notable for the following components:   Bacteria, UA FEW (*)    Trichomonas, UA PRESENT (*)    All other components within normal limits  PREGNANCY, URINE  RPR  HIV ANTIBODY (ROUTINE TESTING W REFLEX)  GC/CHLAMYDIA PROBE AMP ([place]) NOT AT [facility]  GC/CHLAMYDIA PROBE AMP ([place]) NOT AT [facility]    EKG None  Radiology No results found.  Procedures Procedures (including critical care time)  Medications Ordered in ED Medications  HYDROcodone-acetaminophen (NORCO/VICODIN) 5-325 MG per tablet 1 tablet (1 tablet Oral Not Given 05/21/20 1519)  cefTRIAXone (ROCEPHIN) injection 500 mg (has no administration in time range)  doxycycline (VIBRA-TABS) tablet 100 mg (has no administration in time range)  metroNIDAZOLE (FLAGYL) tablet 500 mg (has no administration in time range)  ondansetron (ZOFRAN-ODT) disintegrating tablet 4 mg (4 mg Oral Given 05/21/20 1524)  ketorolac (TORADOL) 30 MG/ML injection 30 mg (30 mg Intravenous Given 05/21/20 1524)   ED Course  I have reviewed the triage vital signs and the nursing notes.  Pertinent labs & imaging results that were available during my care of the patient were reviewed by me and considered in my medical decision making (see chart for details).  34 year old presents for eval.  She is afebrile, nonseptic, not ill-appearing.  Her abdomen is soft, nontender.  Heart and lungs clear.  GU exam with yellow and green discharge with cervical motion tenderness.  No adnexal tenderness.   History and exam consistent with PID.   UA with large leuks, few bacteria, trichomonas present Wet prep with trichomoniasis present, many WBCs GC, Chlamydia, HIV, RPR pending  Patient reassessed.  Tolerating p.o. intake without difficulty.  Pain controlled with Toradol.  Patient will be given Rocephin, doxycycline and Flagyl for PID.  Patient does not appear septic.  She has stable vital signs.  We will have her follow-up with OB/GYN.  On repeat exam patient does not have a surgical abdomin and there are no peritoneal signs.  No indication of appendicitis, bowel obstruction, bowel perforation, cholecystitis, diverticulitis, TOA, torsion, ectopic pregnancy.  Do not feel she needs imaging at this time.  The patient has been appropriately medically screened and/or stabilized in the ED. I have low suspicion for any other emergent medical condition which would require further screening, evaluation or treatment in the ED or require inpatient management.  Patient is hemodynamically stable and in no acute distress.  Patient able to ambulate in department prior to ED.  Evaluation does not show acute pathology that would require ongoing or additional emergent interventions while in the emergency department or further inpatient treatment.  I have discussed the diagnosis with the patient and answered all questions.  Pain is been managed while in the emergency department and patient has no further complaints prior to discharge.  Patient is comfortable with plan discussed in room and is stable for discharge at this time.  I have discussed strict return precautions for returning to the emergency department.  Patient was encouraged to follow-up with PCP/specialist refer to at discharge.     MDM Rules/Calculators/A&P                           Final Clinical Impression(s) / ED Diagnoses Final diagnoses:  PID (acute pelvic inflammatory disease)  Trichomonas infection    Rx / DC Orders ED Discharge Orders          Ordered    metroNIDAZOLE (FLAGYL) 500 MG tablet  2 times daily  05/21/20 1537    doxycycline (VIBRAMYCIN) 100 MG capsule  2 times daily        05/21/20 1537    ondansetron (ZOFRAN ODT) 4 MG disintegrating tablet  Every 8 hours PRN        05/21/20 1537           Baili Stang A, PA-C 05/21/20 1540    Fle[place], Madelaine Bhat, DO 05/22/20 [phone]

## 2020-05-22 LAB — RPR: RPR Ser Ql: NONREACTIVE

## 2020-05-22 LAB — GC/CHLAMYDIA PROBE AMP (~~LOC~~) NOT AT ARMC
Chlamydia: NEGATIVE
Comment: NEGATIVE
Comment: NORMAL
Neisseria Gonorrhea: NEGATIVE

## 2020-05-28 ENCOUNTER — Other Ambulatory Visit: Payer: Self-pay

## 2020-05-28 ENCOUNTER — Encounter (HOSPITAL_BASED_OUTPATIENT_CLINIC_OR_DEPARTMENT_OTHER): Payer: Self-pay | Admitting: Emergency Medicine

## 2020-05-28 DIAGNOSIS — R0981 Nasal congestion: Secondary | ICD-10-CM | POA: Insufficient documentation

## 2020-05-28 DIAGNOSIS — Z87891 Personal history of nicotine dependence: Secondary | ICD-10-CM | POA: Insufficient documentation

## 2020-05-28 DIAGNOSIS — H1013 Acute atopic conjunctivitis, bilateral: Secondary | ICD-10-CM | POA: Insufficient documentation

## 2020-05-28 NOTE — ED Triage Notes (Signed)
Pt states her eyes have been irritated x 3 days  Pt states they have been red and watery  Pt attempted to use visine and states it burned  Pt also c/o runny nose

## 2020-05-29 ENCOUNTER — Emergency Department (HOSPITAL_BASED_OUTPATIENT_CLINIC_OR_DEPARTMENT_OTHER)
Admission: EM | Admit: 2020-05-29 | Discharge: 2020-05-29 | Disposition: A | Discharge status: Home / Self Care | Payer: Self-pay | Attending: Emergency Medicine | Admitting: Emergency Medicine

## 2020-05-29 DIAGNOSIS — H1013 Acute atopic conjunctivitis, bilateral: Secondary | ICD-10-CM

## 2020-05-29 DIAGNOSIS — R0981 Nasal congestion: Secondary | ICD-10-CM

## 2020-05-29 NOTE — ED Provider Notes (Signed)
[facility] EMERGENCY DEPARTMENT Provider Note  CSN: [id number] Arrival date & time: 05/28/20 2317  Chief Complaint(s) eye irritation  HPI Molly Mcknight is a 34 y.o. female who presents to the emergency department with 3 days of nasal congestion and bilateral eye itchiness.  This is exacerbated with fine dust or particles and going outside.  She denied any recent changes in cosmetic products.  No associated fevers or chills.  No coughing.  No nausea or vomiting.  No known sick contacts.  HPI  Past Medical History Past Medical History:  Diagnosis Date  . Anemia   . Chlamydia   . PID (pelvic inflammatory disease)   . Trichimoniasis    There are no problems to display for this patient.  Home Medication(s) Prior to Admission medications   Medication Sig Start Date End Date Taking? Authorizing Provider  cephALEXin (KEFLEX) 500 MG capsule Take 1 capsule (500 mg total) by mouth 2 (two) times daily. 03/21/20   Molpus, John, MD  doxycycline (VIBRAMYCIN) 100 MG capsule Take 1 capsule (100 mg total) by mouth 2 (two) times daily. 05/21/20   Henderly, Britni A, PA-C  fluconazole (DIFLUCAN) 150 MG tablet Take 1 tablet as needed for vaginal yeast infection.  May repeat in 3 days if symptoms persist. 03/21/20   Molpus, Jonny Ruiz, MD  metroNIDAZOLE (FLAGYL) 500 MG tablet Take 1 tablet (500 mg total) by mouth 2 (two) times daily. 05/21/20   Henderly, Britni A, PA-C  ondansetron (ZOFRAN ODT) 4 MG disintegrating tablet Take 1 tablet (4 mg total) by mouth every 8 (eight) hours as needed for nausea or vomiting. 05/21/20   Henderly, Britni A, PA-C  phenazopyridine (PYRIDIUM) 200 MG tablet Take 1 tablet (200 mg total) by mouth 3 (three) times daily as needed for pain. 03/21/20   Molpus, John, MD  fluticasone (FLONASE) 50 MCG/ACT nasal spray Place 1 spray into both nostrils daily. 09/10/19 03/21/20  Tilden Fossa, MD                                                                                                                                     Past Surgical History Past Surgical History:  Procedure Laterality Date  . AMPUTATION FINGER Right   . CHOLECYSTECTOMY    . TUBAL LIGATION     Family History Family History  Problem Relation Age of Onset  . Hypertension Mother   . Diabetes Mother   . Stroke Other   . Diabetes Other   . Hyperlipidemia Other     Social History Social History   Tobacco Use  . Smoking status: Former Smoker    Packs/day: 0.50    Quit date: 04/07/2020    Years since quitting: 0.1  . Smokeless tobacco: Never Used  Vaping Use  . Vaping Use: Former  Substance Use Topics  . Alcohol use: Not Currently  . Drug use: No   Allergies Patient has no known allergies.  Review of Systems Review of Systems All other systems are reviewed and are negative for acute change except as noted in the HPI  Physical Exam Vital Signs  I have reviewed the triage vital signs BP 116/76 (BP Location: Left Arm)   Pulse 61   Temp 98.3 F (36.8 C) (Oral)   Resp 16   Ht 5\' 1"  (1.549 m)   Wt 90.7 kg   LMP 05/24/2020 (Exact Date)   SpO2 100%   BMI 37.79 kg/m   Physical Exam Vitals reviewed.  Constitutional:      General: She is not in acute distress.    Appearance: She is well-developed. She is not diaphoretic.  HENT:     Head: Normocephalic and atraumatic.     Right Ear: External ear normal.     Left Ear: External ear normal.     Nose: Congestion present.  Eyes:     General: No scleral icterus.    Conjunctiva/sclera:     Right eye: Right conjunctiva is injected (mild). No exudate or hemorrhage.    Left eye: Left conjunctiva is injected (mild). No exudate or hemorrhage. Neck:     Trachea: Phonation normal.  Cardiovascular:     Rate and Rhythm: Normal rate and regular rhythm.  Pulmonary:     Effort: Pulmonary effort is normal. No respiratory distress.     Breath sounds: No stridor.  Abdominal:     General: There is no distension.  Musculoskeletal:         General: Normal range of motion.     Cervical back: Normal range of motion.  Neurological:     Mental Status: She is alert and oriented to person, place, and time.  Psychiatric:        Behavior: Behavior normal.     ED Results and Treatments Labs (all labs ordered are listed, but only abnormal results are displayed) Labs Reviewed - No data to display                                                                                                                       EKG  EKG Interpretation  Date/Time:    Ventricular Rate:    PR Interval:    QRS Duration:   QT Interval:    QTC Calculation:   R Axis:     Text Interpretation:        Radiology No results found.  Pertinent labs & imaging results that were available during my care of the patient were reviewed by me and considered in my medical decision making (see chart for details).  Medications Ordered in ED Medications - No data to display  Procedures Procedures  (including critical care time)  Medical Decision Making / ED Course I have reviewed the nursing notes for this encounter and the patient's prior records (if available in EHR or on provided paperwork).   Molly Mcknight was evaluated in Emergency Department on 05/29/2020 for the symptoms described in the history of present illness. She was evaluated in the context of the global COVID-19 pandemic, which necessitated consideration that the patient might be at risk for infection with the SARS-CoV-2 virus that causes COVID-19. Institutional protocols and algorithms that pertain to the evaluation of patients at risk for COVID-19 are in a state of rapid change based on information released by regulatory bodies including the CDC and federal and state organizations. These policies and algorithms were followed during the patient's care in the  ED.  Presentation is most suspicious for allergic conjunctivitis/rhinitis.  Recommended over-the-counter allergy medicines.  Possible early viral infection though less likely.  Recommended over-the-counter allergy medicine.      Final Clinical Impression(s) / ED Diagnoses Final diagnoses:  Allergic conjunctivitis of both eyes  Nasal congestion   The patient appears reasonably screened and/or stabilized for discharge and I doubt any other medical condition or other [facility] requiring further screening, evaluation, or treatment in the ED at this time prior to discharge. Safe for discharge with strict return precautions.  Disposition: Discharge  Condition: Good  I have discussed the results, Dx and Tx plan with the patient/family who expressed understanding and agree(s) with the plan. Discharge instructions discussed at length. The patient/family was given strict return precautions who verbalized understanding of the instructions. No further questions at time of discharge.    ED Discharge Orders    None       Follow Up: Primary care provider  Schedule an appointment as soon as possible for a visit  If you do not have a primary care physician, contact [facility] at [phone] for referral      This chart was dictated using voice recognition software.  Despite best efforts to proofread,  errors can occur which can change the documentation meaning.   Nira Conn, MD 05/29/20 [phone]

## 2020-07-15 ENCOUNTER — Emergency Department (HOSPITAL_BASED_OUTPATIENT_CLINIC_OR_DEPARTMENT_OTHER)
Admission: EM | Admit: 2020-07-15 | Discharge: 2020-07-15 | Disposition: A | Discharge status: Home / Self Care | Payer: Self-pay | Attending: Emergency Medicine | Admitting: Emergency Medicine

## 2020-07-15 ENCOUNTER — Other Ambulatory Visit: Payer: Self-pay

## 2020-07-15 ENCOUNTER — Encounter (HOSPITAL_BASED_OUTPATIENT_CLINIC_OR_DEPARTMENT_OTHER): Payer: Self-pay

## 2020-07-15 DIAGNOSIS — J069 Acute upper respiratory infection, unspecified: Secondary | ICD-10-CM

## 2020-07-15 DIAGNOSIS — Z87891 Personal history of nicotine dependence: Secondary | ICD-10-CM | POA: Insufficient documentation

## 2020-07-15 DIAGNOSIS — Z20822 Contact with and (suspected) exposure to covid-19: Secondary | ICD-10-CM | POA: Insufficient documentation

## 2020-07-15 LAB — RESP PANEL BY RT-PCR (FLU A&B, COVID) ARPGX2
Influenza A by PCR: NEGATIVE
Influenza B by PCR: NEGATIVE
SARS Coronavirus 2 by RT PCR: NEGATIVE

## 2020-07-15 NOTE — Discharge Instructions (Signed)
Your COVID test and influenza tests are negative. Please read instructions below.  You can take tylenol or ibuprofen as needed for sore throat or fever.  Drink plenty of water.  Use saline nasal spray or steroid nasal spray such as flonase for congestion. Follow up with your primary care provider as needed.  Return to the ER for inability to swallow liquids, difficulty breathing, or new or worsening symptoms.

## 2020-07-15 NOTE — ED Triage Notes (Signed)
Pt states she has been having congestion, cough, chills and runny nose for the past week and a half, worse since Saturday. Vaccinated for COVID.

## 2020-07-15 NOTE — ED Provider Notes (Signed)
[facility] EMERGENCY DEPARTMENT Provider Note   CSN: [id number] Arrival date & time: 07/15/20  1321     History Chief Complaint  Patient presents with   Facial Pain    Molly Mcknight is a 34 y.o. female presenting to the ED with complaint of URI symptoms that began on Saturday 07/13/2020.  Patient states over the last week or so she was feeling more fatigue until Saturday she developed URI symptoms.  She states she has sinus pain and congestion.  She has a mild sore throat and mild intermittent cough.  She also has been having chills without fevers.  She reports some mild body aches at nighttime.  Treated symptoms with over-the-counter cold medications.  She left work today for [employer].  She states she has had COVID-19 in the past, also has completed the vaccines.  Symptoms feel similar to Covid.  The history is provided by the patient.       Past Medical History:  Diagnosis Date   Anemia    Chlamydia    PID (pelvic inflammatory disease)    Trichimoniasis     There are no problems to display for this patient.   Past Surgical History:  Procedure Laterality Date   AMPUTATION FINGER Right    CHOLECYSTECTOMY     TUBAL LIGATION       OB History    Gravida  3   Para  1   Term  1   Preterm  0   AB  1   Living  1     SAB  1   TAB  0   Ectopic  0   Multiple  0   Live Births  1           Family History  Problem Relation Age of Onset   Hypertension Mother    Diabetes Mother    Stroke Other    Diabetes Other    Hyperlipidemia Other     Social History   Tobacco Use   Smoking status: Former Smoker    Packs/day: 0.50    Quit date: 04/07/2020    Years since quitting: 0.2   Smokeless tobacco: Never Used  Vaping Use   Vaping Use: Former  Substance Use Topics   Alcohol use: Not Currently   Drug use: No    Home Medications Prior to Admission medications   Medication Sig Start Date End Date Taking? Authorizing  Provider  cephALEXin (KEFLEX) 500 MG capsule Take 1 capsule (500 mg total) by mouth 2 (two) times daily. 03/21/20   Molpus, John, MD  doxycycline (VIBRAMYCIN) 100 MG capsule Take 1 capsule (100 mg total) by mouth 2 (two) times daily. 05/21/20   Henderly, Britni A, PA-C  fluconazole (DIFLUCAN) 150 MG tablet Take 1 tablet as needed for vaginal yeast infection.  May repeat in 3 days if symptoms persist. 03/21/20   Molpus, Jonny Ruiz, MD  metroNIDAZOLE (FLAGYL) 500 MG tablet Take 1 tablet (500 mg total) by mouth 2 (two) times daily. 05/21/20   Henderly, Britni A, PA-C  ondansetron (ZOFRAN ODT) 4 MG disintegrating tablet Take 1 tablet (4 mg total) by mouth every 8 (eight) hours as needed for nausea or vomiting. 05/21/20   Henderly, Britni A, PA-C  phenazopyridine (PYRIDIUM) 200 MG tablet Take 1 tablet (200 mg total) by mouth 3 (three) times daily as needed for pain. 03/21/20   Molpus, John, MD  fluticasone (FLONASE) 50 MCG/ACT nasal spray Place 1 spray into both nostrils daily. 09/10/19  03/21/20  Tilden Fossa, MD    Allergies    Amoxicillin  Review of Systems   Review of Systems  Constitutional: Positive for chills and fatigue. Negative for fever.  HENT: Positive for congestion, sinus pain and sore throat. Negative for ear pain.   Respiratory: Positive for cough. Negative for shortness of breath.   All other systems reviewed and are negative.   Physical Exam Updated Vital Signs BP 134/74 (BP Location: Left Arm)    Pulse 71    Temp 98.4 F (36.9 C) (Oral)    Resp 18    Ht 5\' 1"  (1.549 m)    Wt 90.7 kg    LMP 06/17/2020    SpO2 99%    BMI 37.79 kg/m   Physical Exam Vitals and nursing note reviewed.  Constitutional:      General: She is not in acute distress.    Appearance: She is well-developed.  HENT:     Head: Normocephalic and atraumatic.     Right Ear: Tympanic membrane and ear canal normal.     Left Ear: Tympanic membrane and ear canal normal.     Nose: Mucosal edema and congestion present.      Mouth/Throat:     Mouth: Mucous membranes are moist.     Comments: Posterior oropharyngeal erythema is present, no edema or exudates.  Uvula is midline, tolerating secretions.  No trismus. Eyes:     Conjunctiva/sclera: Conjunctivae normal.  Cardiovascular:     Rate and Rhythm: Normal rate and regular rhythm.  Pulmonary:     Effort: Pulmonary effort is normal. No respiratory distress.     Breath sounds: Normal breath sounds.  Musculoskeletal:     Cervical back: Normal range of motion and neck supple. No tenderness.  Lymphadenopathy:     Cervical: No cervical adenopathy.  Neurological:     Mental Status: She is alert.  Psychiatric:        Mood and Affect: Mood normal.        Behavior: Behavior normal.     ED Results / Procedures / Treatments   Labs (all labs ordered are listed, but only abnormal results are displayed) Labs Reviewed  RESP PANEL BY RT-PCR (FLU A&B, COVID) [id number]    EKG None  Radiology No results found.  Procedures Procedures (including critical care time)  Medications Ordered in ED Medications - No data to display  ED Course  I have reviewed the triage vital signs and the nursing notes.  Pertinent labs & imaging results that were available during my care of the patient were reviewed by me and considered in my medical decision making (see chart for details).  Molly Mcknight was evaluated in Emergency Department on 07/15/2020 for the symptoms described in the history of present illness. She was evaluated in the context of the global COVID-19 pandemic, which necessitated consideration that the patient might be at risk for infection with the SARS-CoV-2 virus that causes COVID-19. Institutional protocols and algorithms that pertain to the evaluation of patients at risk for COVID-19 are in a state of rapid change based on information released by regulatory bodies including the CDC and federal and state organizations. These policies and algorithms were followed  during the patient's care in the ED.    MDM Rules/Calculators/A&P                          Patients symptoms are consistent with URI, likely viral etiology. Afebrile, tolerating secretions.  Lungs clear to auscultation bilaterally. COVID/influenza swab collected in triage and is negative. Discussed possibility of viral sinusitis. Discussed that antibiotics are not indicated for viral infections. Pt instructed to return for 10 days of persistent congestion with sinus pain, as abx would be indicated at that time. Pt will be discharged with symptomatic treatment.  Verbalizes understanding and is agreeable with plan. Pt is hemodynamically stable & in NAD prior to dc.  Discussed results, findings, treatment and follow up. Patient advised of return precautions. Patient verbalized understanding and agreed with plan.  Final Clinical Impression(s) / ED Diagnoses Final diagnoses:  Viral URI with cough    Rx / DC Orders ED Discharge Orders    None       Loura Pitt, [place] N, PA-C 07/15/20 1439    Benjiman Core, MD 07/15/20 [phone]

## 2020-11-08 ENCOUNTER — Emergency Department (HOSPITAL_BASED_OUTPATIENT_CLINIC_OR_DEPARTMENT_OTHER)
Admission: EM | Admit: 2020-11-08 | Discharge: 2020-11-08 | Disposition: A | Discharge status: Home / Self Care | Payer: Self-pay | Attending: Emergency Medicine | Admitting: Emergency Medicine

## 2020-11-08 ENCOUNTER — Encounter (HOSPITAL_BASED_OUTPATIENT_CLINIC_OR_DEPARTMENT_OTHER): Payer: Self-pay | Admitting: *Deleted

## 2020-11-08 ENCOUNTER — Other Ambulatory Visit: Payer: Self-pay

## 2020-11-08 DIAGNOSIS — A599 Trichomoniasis, unspecified: Secondary | ICD-10-CM | POA: Insufficient documentation

## 2020-11-08 DIAGNOSIS — N739 Female pelvic inflammatory disease, unspecified: Secondary | ICD-10-CM | POA: Insufficient documentation

## 2020-11-08 DIAGNOSIS — Z87891 Personal history of nicotine dependence: Secondary | ICD-10-CM | POA: Insufficient documentation

## 2020-11-08 LAB — URINALYSIS, ROUTINE W REFLEX MICROSCOPIC
Bilirubin Urine: NEGATIVE
Glucose, UA: NEGATIVE mg/dL
Hgb urine dipstick: NEGATIVE
Ketones, ur: NEGATIVE mg/dL
Nitrite: NEGATIVE
Protein, ur: NEGATIVE mg/dL
Specific Gravity, Urine: 1.02 (ref 1.005–1.030)
pH: 8 (ref 5.0–8.0)

## 2020-11-08 LAB — URINALYSIS, MICROSCOPIC (REFLEX)

## 2020-11-08 LAB — WET PREP, GENITAL
Clue Cells Wet Prep HPF POC: NONE SEEN
Sperm: NONE SEEN
Yeast Wet Prep HPF POC: NONE SEEN

## 2020-11-08 LAB — PREGNANCY, URINE: Preg Test, Ur: NEGATIVE

## 2020-11-08 MED ORDER — DOXYCYCLINE HYCLATE 100 MG PO CAPS: 100.0000 mg | ORAL_CAPSULE | Freq: Two times a day (BID) | ORAL | 0 refills | Status: AC

## 2020-11-08 MED ORDER — METRONIDAZOLE 500 MG PO TABS: 500.0000 mg | ORAL_TABLET | Freq: Two times a day (BID) | ORAL | 0 refills | Status: AC

## 2020-11-08 MED ORDER — LIDOCAINE HCL (PF) 1 % IJ SOLN
INTRAMUSCULAR | Status: AC
  Administered 2020-11-08: 5 mL
  Filled 2020-11-08: qty 5

## 2020-11-08 MED ORDER — DOXYCYCLINE HYCLATE 100 MG PO TABS
100.0000 mg | ORAL_TABLET | Freq: Once | ORAL | Status: AC
  Administered 2020-11-08: 100 mg via ORAL
  Filled 2020-11-08: qty 1

## 2020-11-08 MED ORDER — CEFTRIAXONE SODIUM 500 MG IJ SOLR
500.0000 mg | Freq: Once | INTRAMUSCULAR | Status: AC
  Administered 2020-11-08: 500 mg via INTRAMUSCULAR
  Filled 2020-11-08: qty 500

## 2020-11-08 MED ORDER — METRONIDAZOLE 500 MG PO TABS
500.0000 mg | ORAL_TABLET | Freq: Once | ORAL | Status: AC
  Administered 2020-11-08: 500 mg via ORAL
  Filled 2020-11-08: qty 1

## 2020-11-08 NOTE — ED Provider Notes (Signed)
[facility] EMERGENCY DEPARTMENT Provider Note   CSN: [id number] Arrival date & time: 11/08/20  1436     History Chief Complaint  Patient presents with  . Vaginal Discharge    Molly Mcknight is a 35 y.o. female w PMHx PID, chlamydia, trichomonas, BV, presenting to the ED with complaint of vaginal discharge that began on Monday.  She states this began right after her menstrual period stopped.  She is having a yellowish vaginal discharge with some pelvic discomfort and vaginal itching.  States her symptoms do not feel as consistent with BV, she does not note any odor or any urinary urgency which she normally has with BV.  States she is in monogamous relationship with a female partner, does not use protection.  Denies fevers, nausea, vomiting, urinary symptoms.  States she is uninsured and does not have a gynecologist.  The history is provided by the patient.       Past Medical History:  Diagnosis Date  . Anemia   . Chlamydia   . PID (pelvic inflammatory disease)   . Trichimoniasis     There are no problems to display for this patient.   Past Surgical History:  Procedure Laterality Date  . AMPUTATION FINGER Right   . CHOLECYSTECTOMY    . TUBAL LIGATION       OB History    Gravida  3   Para  1   Term  1   Preterm  0   AB  1   Living  1     SAB  1   IAB  0   Ectopic  0   Multiple  0   Live Births  1           Family History  Problem Relation Age of Onset  . Hypertension Mother   . Diabetes Mother   . Stroke Other   . Diabetes Other   . Hyperlipidemia Other     Social History   Tobacco Use  . Smoking status: Former Smoker    Packs/day: 0.50    Quit date: 04/07/2020    Years since quitting: 0.5  . Smokeless tobacco: Never Used  Vaping Use  . Vaping Use: Former  Substance Use Topics  . Alcohol use: Not Currently  . Drug use: No    Home Medications Prior to Admission medications   Medication Sig Start Date End Date Taking?  Authorizing Provider  cephALEXin (KEFLEX) 500 MG capsule Take 1 capsule (500 mg total) by mouth 2 (two) times daily. 03/21/20   Molpus, John, MD  doxycycline (VIBRAMYCIN) 100 MG capsule Take 1 capsule (100 mg total) by mouth 2 (two) times daily for 14 days. 11/08/20 11/22/20  Aunisty Reali, [place] N, PA-C  fluconazole (DIFLUCAN) 150 MG tablet Take 1 tablet as needed for vaginal yeast infection.  May repeat in 3 days if symptoms persist. 03/21/20   Molpus, Jonny Ruiz, MD  metroNIDAZOLE (FLAGYL) 500 MG tablet Take 1 tablet (500 mg total) by mouth 2 (two) times daily for 14 days. 11/08/20 11/22/20  Roshni Burbano, [place] N, PA-C  ondansetron (ZOFRAN ODT) 4 MG disintegrating tablet Take 1 tablet (4 mg total) by mouth every 8 (eight) hours as needed for nausea or vomiting. 05/21/20   Henderly, Britni A, PA-C  phenazopyridine (PYRIDIUM) 200 MG tablet Take 1 tablet (200 mg total) by mouth 3 (three) times daily as needed for pain. 03/21/20   Molpus, John, MD  fluticasone (FLONASE) 50 MCG/ACT nasal spray Place 1 spray into both  nostrils daily. 09/10/19 03/21/20  Tilden Fossa, MD    Allergies    Amoxicillin  Review of Systems   Review of Systems  Constitutional: Negative for chills and fever.  Genitourinary: Positive for vaginal discharge.       Vaginal itching  All other systems reviewed and are negative.   Physical Exam Updated Vital Signs BP 104/71 (BP Location: Right Arm)   Pulse 76   Temp 98.3 F (36.8 C) (Oral)   Resp 14   Ht 5\' 1"  (1.549 m)   Wt 101.4 kg   LMP 10/28/2020   SpO2 96%   BMI 42.25 kg/m   Physical Exam Vitals and nursing note reviewed. Exam conducted with a chaperone present.  Constitutional:      Appearance: She is well-developed.  HENT:     Head: Normocephalic and atraumatic.  Eyes:     Conjunctiva/sclera: Conjunctivae normal.  Cardiovascular:     Rate and Rhythm: Normal rate and regular rhythm.  Pulmonary:     Effort: Pulmonary effort is normal. No respiratory distress.     Breath  sounds: Normal breath sounds.  Abdominal:     General: Bowel sounds are normal.     Palpations: Abdomen is soft.     Tenderness: There is no guarding or rebound.     Comments: Discomfort with palpation of the lower abdomen  Genitourinary:    General: Normal vulva.     Comments: Exam performed with female RN chaperone present. Vulva is normal.  There is small to moderate amount of thin yellow discharge present.  Cervix does not appear friable.  There is generalized tenderness about the bilateral adnexa and cervix. Skin:    General: Skin is warm.  Neurological:     Mental Status: She is alert.  Psychiatric:        Behavior: Behavior normal.     ED Results / Procedures / Treatments   Labs (all labs ordered are listed, but only abnormal results are displayed) Labs Reviewed  WET PREP, GENITAL - Abnormal; Notable for the following components:      Result Value   Trich, Wet Prep PRESENT (*)    WBC, Wet Prep HPF POC MANY (*)    All other components within normal limits  URINALYSIS, ROUTINE W REFLEX MICROSCOPIC - Abnormal; Notable for the following components:   APPearance CLOUDY (*)    Leukocytes,Ua SMALL (*)    All other components within normal limits  URINALYSIS, MICROSCOPIC (REFLEX) - Abnormal; Notable for the following components:   Bacteria, UA MANY (*)    Trichomonas, UA PRESENT (*)    All other components within normal limits  PREGNANCY, URINE  GC/CHLAMYDIA PROBE AMP ([place]) NOT AT [facility]    EKG None  Radiology No results found.  Procedures Procedures   Medications Ordered in ED Medications  cefTRIAXone (ROCEPHIN) injection 500 mg (has no administration in time range)  doxycycline (VIBRA-TABS) tablet 100 mg (has no administration in time range)  metroNIDAZOLE (FLAGYL) tablet 500 mg (has no administration in time range)    ED Course  I have reviewed the triage vital signs and the nursing notes.  Pertinent labs & imaging results that were available during  my care of the patient were reviewed by me and considered in my medical decision making (see chart for details).    MDM Rules/Calculators/A&P                          Patient is  35 year old female presenting for abnormal vaginal discharge, vaginal itching and lower abdominal discomfort since Monday.  Per review of medical record, patient has had multiple positive STD screens for gonorrhea and/or chlamydia.  Has also been diagnosed with PID on multiple occasions.  States she does not have a gynecologist due to lack of health insurance.  She is sexually active with one female partner, does not use protection.  Examination today reveals yellow thin vaginal discharge, mild generalized tenderness about the adnexa and cervix.  Wet prep with trichomonas and many white cells.  Considering patient is positive for STI with pelvic discomfort and tenderness on exam, will cover for PID with Rocephin and doxy, trichomonas treated with Flagyl.  She is overall well-appearing, afebrile.  Low suspicion for TOA.  She is strongly encouraged to establish care with gynecology for more regular management of her pelvic complaints.  She is provided with referral.  Stressed the importance of finishing antibiotics.  Also instructed to inform all partners of her positive diagnosis today so they can receive treatment.  She is aware she has STD cultures pending as well.  Discharge no acute distress  Discussed results, findings, treatment and follow up. Patient advised of return precautions. Patient verbalized understanding and agreed with plan.  Final Clinical Impression(s) / ED Diagnoses Final diagnoses:  Trichomoniasis  PID (pelvic inflammatory disease)    Rx / DC Orders ED Discharge Orders         Ordered    doxycycline (VIBRAMYCIN) 100 MG capsule  2 times daily        11/08/20 1615    metroNIDAZOLE (FLAGYL) 500 MG tablet  2 times daily        11/08/20 1615           Anavey Coombes, [place] N, [place] 11/08/20 1620     Tegeler, Canary Brim, MD 11/09/20 0004

## 2020-11-08 NOTE — ED Triage Notes (Signed)
Vaginal discharge, cramping, and itching since her menses stopped last week.

## 2020-11-08 NOTE — Discharge Instructions (Signed)
Please read the instructions below.  Talk with your primary care provider about any new medications. Please schedule an appointment for follow up with the OB/GYN specialists.   Please take the antibiotic, Flagyl (Metronidazole), every 12 hours until gone. Do not drink alcohol with this medication as it will cause vomiting.  Please take the antibiotic, Doxycycline, every 12 hours until gone. A side effect of this medication includes hypersensitivity to the suns rays - please take measures to protect your skin from the sun while taking this medication.  Trichomonas is a sexually transmitted infection.  Please be sure your sexual partners are aware of this diagnosis so they can receive treatment as well. You will receive a call from the hospital if your test results come back positive. Avoid all sexual activity until you know your test results. If your results come back positive, it is important that you inform all of your sexual partners.  Return to the ER for high fever, severe abdominal pain, or new or worsening symptoms.

## 2020-11-11 LAB — GC/CHLAMYDIA PROBE AMP (~~LOC~~) NOT AT ARMC
Chlamydia: NEGATIVE
Comment: NEGATIVE
Comment: NORMAL
Neisseria Gonorrhea: NEGATIVE

## 2020-12-24 ENCOUNTER — Other Ambulatory Visit: Payer: Self-pay

## 2020-12-24 ENCOUNTER — Encounter (HOSPITAL_BASED_OUTPATIENT_CLINIC_OR_DEPARTMENT_OTHER): Payer: Self-pay | Admitting: Emergency Medicine

## 2020-12-24 ENCOUNTER — Emergency Department (HOSPITAL_BASED_OUTPATIENT_CLINIC_OR_DEPARTMENT_OTHER)
Admission: EM | Admit: 2020-12-24 | Discharge: 2020-12-24 | Disposition: A | Discharge status: Home / Self Care | Payer: BC Managed Care – PPO | Attending: Emergency Medicine | Admitting: Emergency Medicine

## 2020-12-24 DIAGNOSIS — Z87891 Personal history of nicotine dependence: Secondary | ICD-10-CM | POA: Diagnosis not present

## 2020-12-24 DIAGNOSIS — R21 Rash and other nonspecific skin eruption: Secondary | ICD-10-CM | POA: Diagnosis present

## 2020-12-24 DIAGNOSIS — L509 Urticaria, unspecified: Secondary | ICD-10-CM | POA: Insufficient documentation

## 2020-12-24 MED ORDER — FAMOTIDINE 20 MG PO TABS
20.0000 mg | ORAL_TABLET | Freq: Once | ORAL | Status: AC
  Administered 2020-12-24: 20 mg via ORAL
  Filled 2020-12-24: qty 1

## 2020-12-24 MED ORDER — FAMOTIDINE 20 MG PO TABS: 20.0000 mg | ORAL_TABLET | Freq: Every day | ORAL | 0 refills | Status: DC

## 2020-12-24 MED ORDER — PREDNISONE 50 MG PO TABS
60.0000 mg | ORAL_TABLET | Freq: Once | ORAL | Status: AC
  Administered 2020-12-24: 60 mg via ORAL
  Filled 2020-12-24: qty 1

## 2020-12-24 MED ORDER — PREDNISONE 10 MG PO TABS: 40.0000 mg | ORAL_TABLET | Freq: Every day | ORAL | 0 refills | Status: DC

## 2020-12-24 MED ORDER — DIPHENHYDRAMINE HCL 25 MG PO TABS: 25.0000 mg | ORAL_TABLET | Freq: Four times a day (QID) | ORAL | 0 refills | Status: DC | PRN

## 2020-12-24 MED ORDER — DIPHENHYDRAMINE HCL 25 MG PO CAPS
50.0000 mg | ORAL_CAPSULE | Freq: Once | ORAL | Status: AC
  Administered 2020-12-24: 50 mg via ORAL
  Filled 2020-12-24: qty 2

## 2020-12-24 NOTE — ED Notes (Signed)
ED Provider at bedside. 

## 2020-12-24 NOTE — ED Provider Notes (Signed)
[facility] EMERGENCY DEPARTMENT Provider Note   CSN: [id number] Arrival date & time: 12/24/20  0507     History Chief Complaint  Patient presents with  . Allergic Reaction    Molly Mcknight is a 35 y.o. female.  HPI     This is a 35 year old female with a history of anemia who presents with itchy rash.  Patient reports that she was at work when she went to wash her hands before break.  She ate a chicken sandwich from the vending machine and a bag of chips.  She had onset of bilateral hand and arm itching and noted a rash.  The rash progressed and began to involve her trunk.  She has not had any difficulty swallowing or shortness of breath.  No nausea or vomiting.  No chest pain.  She has no known food allergies although she had never eaten this particular thing from the vending machine before.  She also has never reacted to the soap in the bathroom.  She is not taking anything for her symptoms.  Past Medical History:  Diagnosis Date  . Anemia   . Chlamydia   . PID (pelvic inflammatory disease)   . Trichimoniasis     There are no problems to display for this patient.   Past Surgical History:  Procedure Laterality Date  . AMPUTATION FINGER Right   . CHOLECYSTECTOMY    . TUBAL LIGATION       OB History    Gravida  3   Para  1   Term  1   Preterm  0   AB  1   Living  1     SAB  1   IAB  0   Ectopic  0   Multiple  0   Live Births  1           Family History  Problem Relation Age of Onset  . Hypertension Mother   . Diabetes Mother   . Stroke Other   . Diabetes Other   . Hyperlipidemia Other     Social History   Tobacco Use  . Smoking status: Former Smoker    Packs/day: 0.50    Quit date: 04/07/2020    Years since quitting: 0.7  . Smokeless tobacco: Never Used  Vaping Use  . Vaping Use: Former  Substance Use Topics  . Alcohol use: Not Currently  . Drug use: No    Home Medications Prior to Admission medications    Medication Sig Start Date End Date Taking? Authorizing Provider  diphenhydrAMINE (BENADRYL) 25 MG tablet Take 1 tablet (25 mg total) by mouth every 6 (six) hours as needed. 12/24/20  Yes Dejohn Ibarra, Mayer Masker, MD  famotidine (PEPCID) 20 MG tablet Take 1 tablet (20 mg total) by mouth daily. 12/24/20  Yes Levorn Oleski, Mayer Masker, MD  predniSONE (DELTASONE) 10 MG tablet Take 4 tablets (40 mg total) by mouth daily. 12/24/20  Yes Allen Egerton, Mayer Masker, MD  cephALEXin (KEFLEX) 500 MG capsule Take 1 capsule (500 mg total) by mouth 2 (two) times daily. 03/21/20   Molpus, John, MD  fluconazole (DIFLUCAN) 150 MG tablet Take 1 tablet as needed for vaginal yeast infection.  May repeat in 3 days if symptoms persist. 03/21/20   Molpus, Jonny Ruiz, MD  ondansetron (ZOFRAN ODT) 4 MG disintegrating tablet Take 1 tablet (4 mg total) by mouth every 8 (eight) hours as needed for nausea or vomiting. 05/21/20   Henderly, Britni A, PA-C  phenazopyridine (PYRIDIUM) 200  MG tablet Take 1 tablet (200 mg total) by mouth 3 (three) times daily as needed for pain. 03/21/20   Molpus, John, MD  fluticasone (FLONASE) 50 MCG/ACT nasal spray Place 1 spray into both nostrils daily. 09/10/19 03/21/20  Tilden Fossa, MD    Allergies    Amoxicillin  Review of Systems   Review of Systems  Constitutional: Negative for fever.  Respiratory: Negative for shortness of breath.   Cardiovascular: Negative for chest pain.  Gastrointestinal: Negative for abdominal pain, nausea and vomiting.  Genitourinary: Negative for dysuria.  Skin: Positive for rash.  All other systems reviewed and are negative.   Physical Exam Updated Vital Signs BP 112/83   Pulse 63   Temp 98.1 F (36.7 C) (Oral)   Resp 16   Ht 1.549 m (5\' 1" )   Wt 99.8 kg   SpO2 100%   BMI 41.57 kg/m   Physical Exam Vitals and nursing note reviewed.  Constitutional:      Appearance: She is well-developed. She is obese. She is not ill-appearing.  HENT:     Head: Normocephalic and atraumatic.      Nose: Nose normal.     Mouth/Throat:     Mouth: Mucous membranes are moist.     Comments: No oropharyngeal swelling noted Eyes:     Pupils: Pupils are equal, round, and reactive to light.  Cardiovascular:     Rate and Rhythm: Normal rate and regular rhythm.     Heart sounds: Normal heart sounds.  Pulmonary:     Effort: Pulmonary effort is normal. No respiratory distress.     Breath sounds: No wheezing.  Abdominal:     Palpations: Abdomen is soft.     Tenderness: There is no abdominal tenderness.  Musculoskeletal:     Cervical back: Neck supple.     Right lower leg: No edema.     Left lower leg: No edema.  Skin:    General: Skin is warm and dry.     Comments: Scattered hives noted over the trunk and bilateral upper extremities, patient's tattoos are palpable on the bilateral upper extremities, excoriations overlying noted  Neurological:     Mental Status: She is alert and oriented to person, place, and time.  Psychiatric:        Mood and Affect: Mood normal.     ED Results / Procedures / Treatments   Labs (all labs ordered are listed, but only abnormal results are displayed) Labs Reviewed - No data to display  EKG None  Radiology No results found.  Procedures Procedures   Medications Ordered in ED Medications  predniSONE (DELTASONE) tablet 60 mg (60 mg Oral Given 12/24/20 0527)  diphenhydrAMINE (BENADRYL) capsule 50 mg (50 mg Oral Given 12/24/20 0527)  famotidine (PEPCID) tablet 20 mg (20 mg Oral Given 12/24/20 02/23/21)    ED Course  I have reviewed the triage vital signs and the nursing notes.  Pertinent labs & imaging results that were available during my care of the patient were reviewed by me and considered in my medical decision making (see chart for details).    MDM Rules/Calculators/A&P                          Patient presents with a rash.  She is overall nontoxic and vital signs are reassuring.  Rash is consistent with urticaria.  Unknown allergen  although patient states onset after washing her hands and eating a sandwich from the vending  machine.  She has no signs or symptoms of anaphylaxis at this time.  She was given steroids, Benadryl, and Pepcid.  On recheck, rash has completely defervesced.  She states she feels much better.  We will send out with 5-day course of prednisone and Benadryl as needed.  Patient instructed to avoid potential allergens.  Patient stated understanding.  After history, exam, and medical workup I feel the patient has been appropriately medically screened and is safe for discharge home. Pertinent diagnoses were discussed with the patient. Patient was given return precautions.  Final Clinical Impression(s) / ED Diagnoses Final diagnoses:  Urticaria    Rx / DC Orders ED Discharge Orders         Ordered    predniSONE (DELTASONE) 10 MG tablet  Daily        12/24/20 0628    famotidine (PEPCID) 20 MG tablet  Daily        12/24/20 0628    diphenhydrAMINE (BENADRYL) 25 MG tablet  Every 6 hours PRN        12/24/20 [id number]           Shon Baton, MD 12/24/20 0630

## 2020-12-24 NOTE — ED Triage Notes (Signed)
Pt reports itching a rash to unknown allergen. Started while at work just [employer]

## 2020-12-24 NOTE — Discharge Instructions (Signed)
You were seen today and found to have hives.  Avoid eliciting foods.  Take prednisone for the next 5 days.  You may use Benadryl as needed for itching.  If you develop shortness of breath, nausea, throat or mouth swelling, you should be reevaluated.

## 2021-02-04 IMAGING — CT CT RENAL STONE PROTOCOL
2 of 4 series · 17 of 46 positions shown, 19 images · non-contrast
Comparison: Pelvic ultrasound, 11/14/2018

CLINICAL DATA: Right-sided flank pain, nausea, vomiting

EXAM:
CT ABDOMEN AND PELVIS WITHOUT CONTRAST
TECHNIQUE: Multidetector CT imaging of the abdomen and pelvis was performed
following the standard protocol without IV contrast.

[Series 2: axial st · axial · 0.84mm/px · z∈[-527,-67]mm · 14 of 102 slices shown, 16 images]
[im 5/102  soft-tissue]
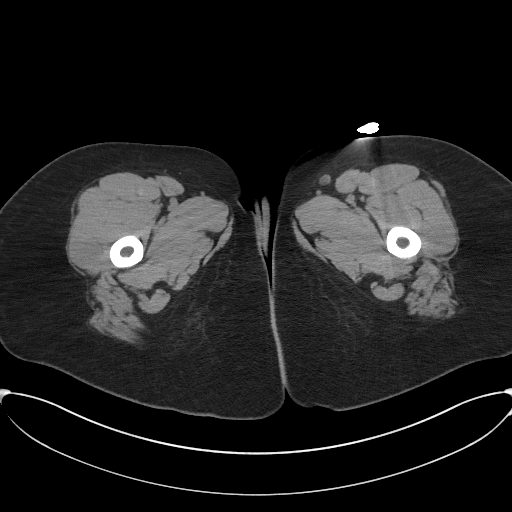
[im 5/102  bone]
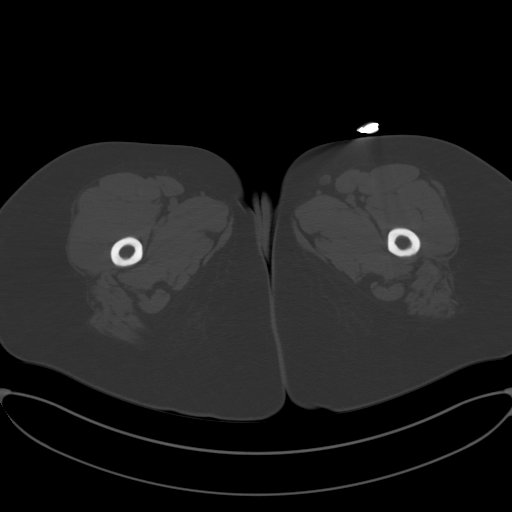
[im 14/102  soft-tissue]
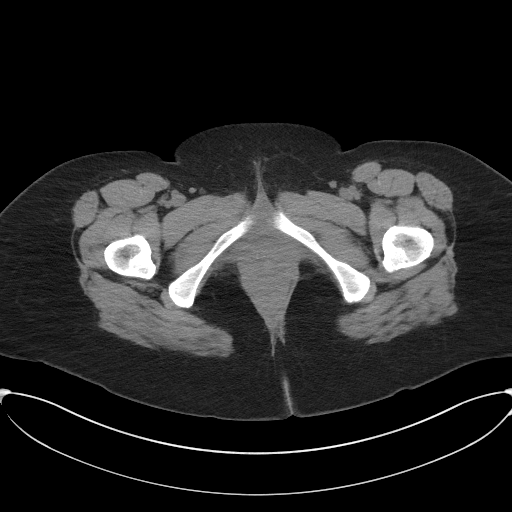
[im 18/102  soft-tissue]
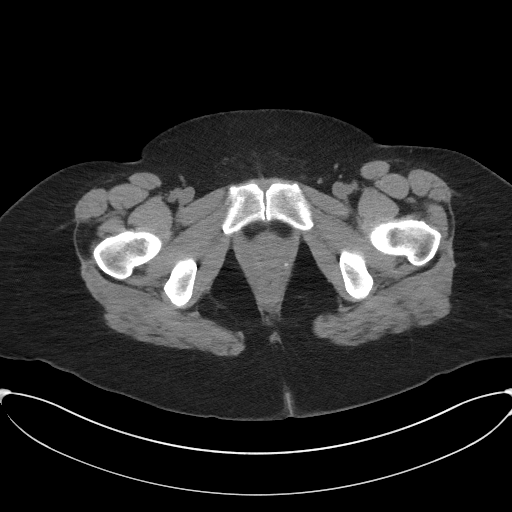
[im 27/102  soft-tissue]
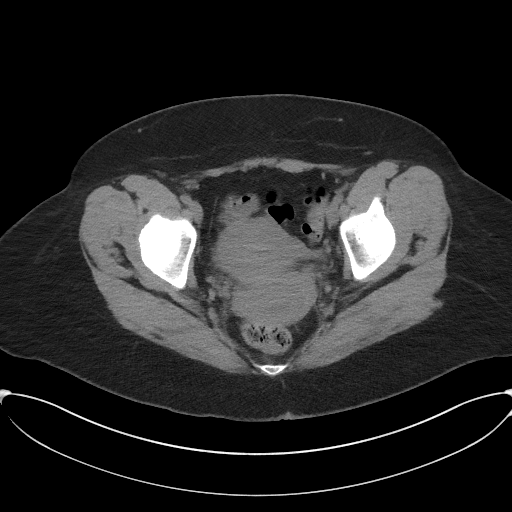
[im 36/102  soft-tissue]
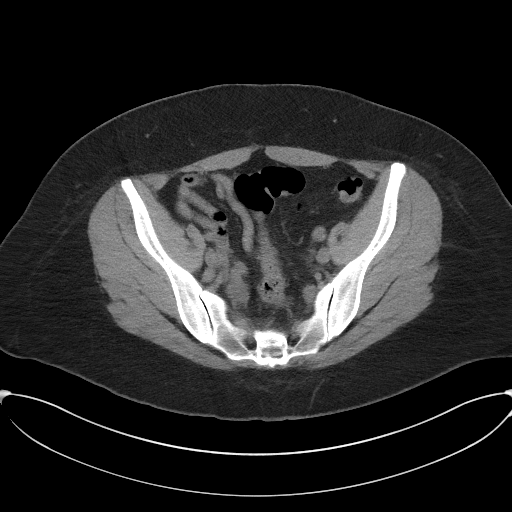
[im 40/102  soft-tissue]
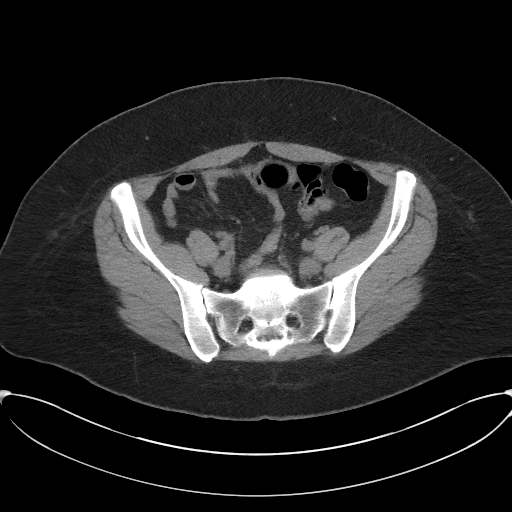
[im 49/102  soft-tissue]
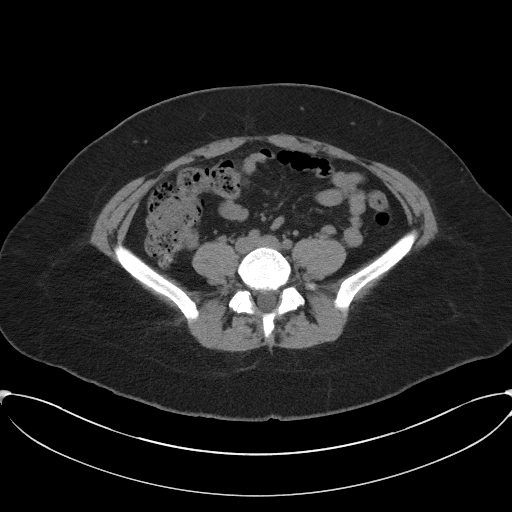
[im 53/102  soft-tissue]
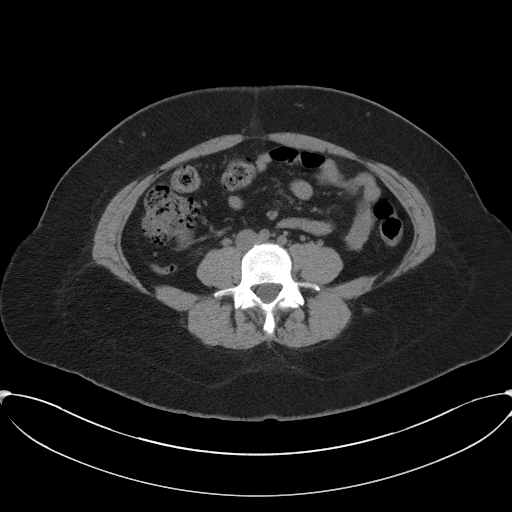
[im 62/102  soft-tissue]
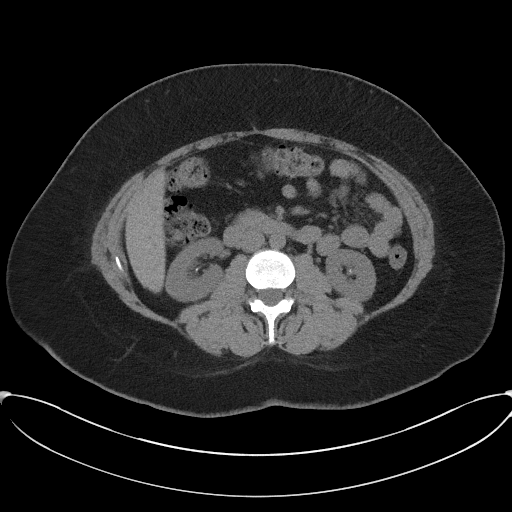
[im 62/102  bone]
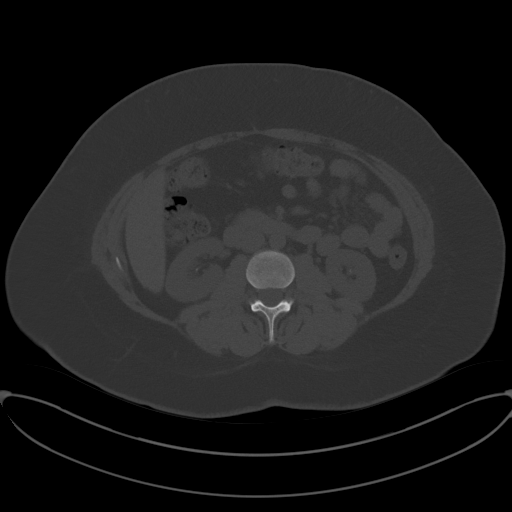
[im 66/102  soft-tissue]
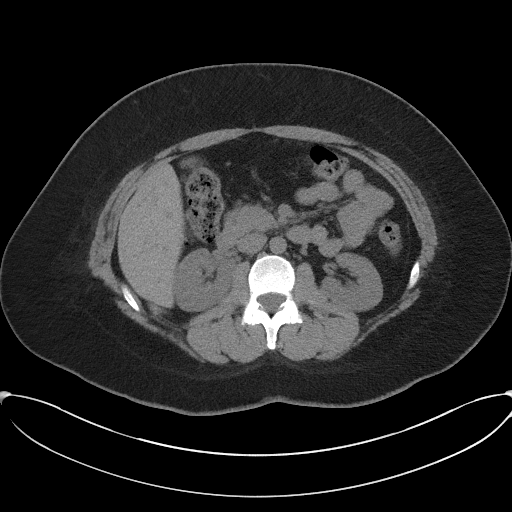
[im 75/102  soft-tissue]
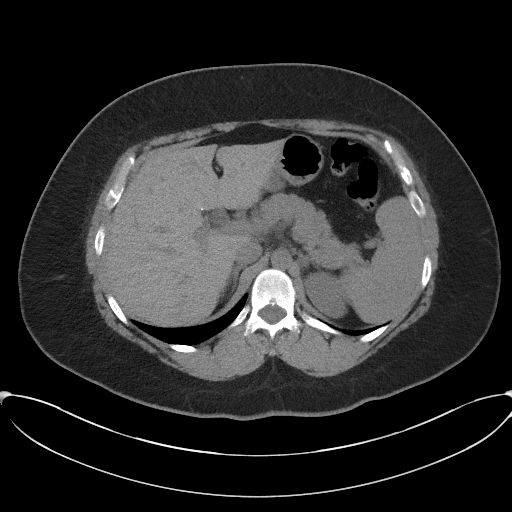
[im 84/102  soft-tissue]
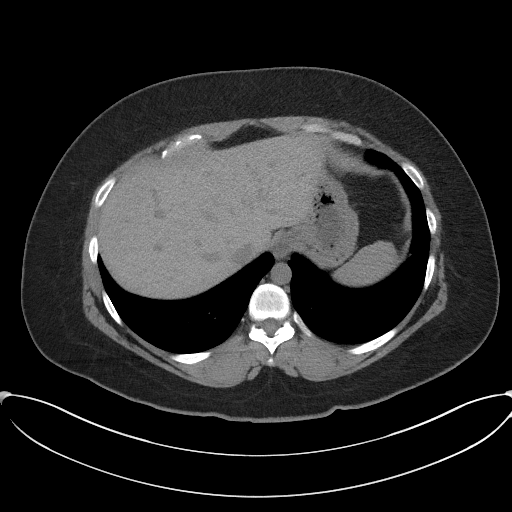
[im 88/102  soft-tissue]
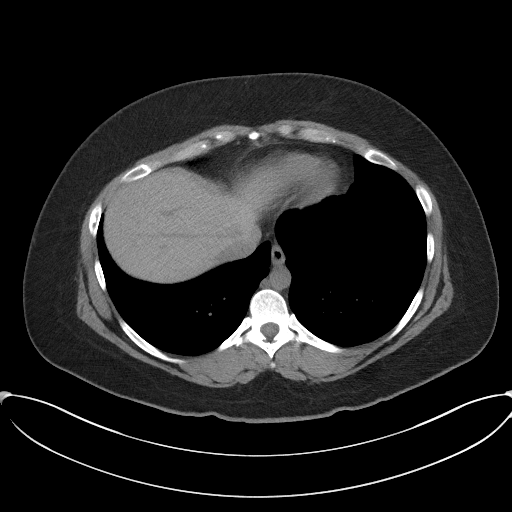
[im 97/102  soft-tissue]
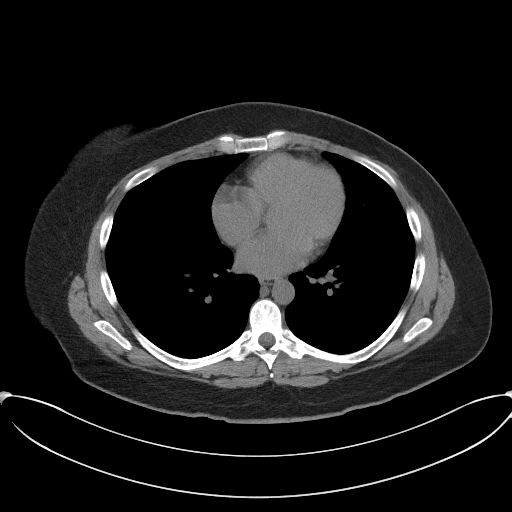

[Series 4: coronal st · coronal · 0.90mm/px · 3 of 101 slices shown]
[im 34/101  soft-tissue]
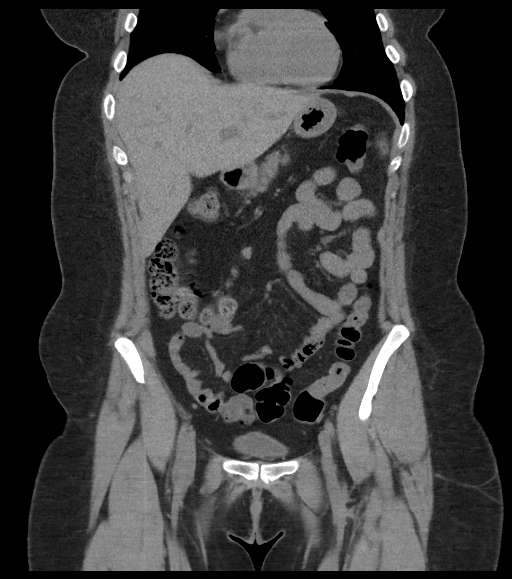
[im 45/101  soft-tissue]
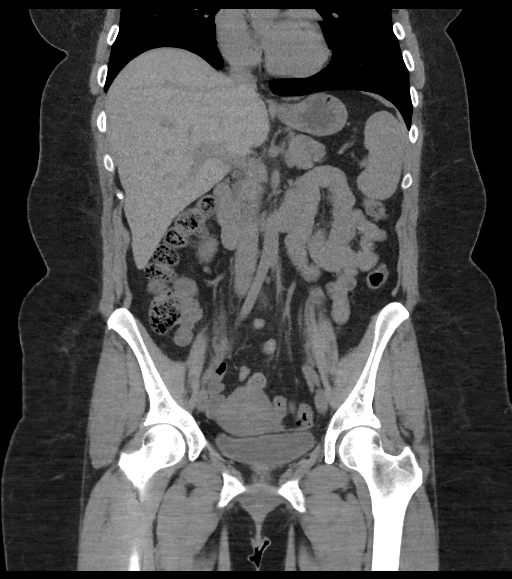
[im 56/101  soft-tissue]
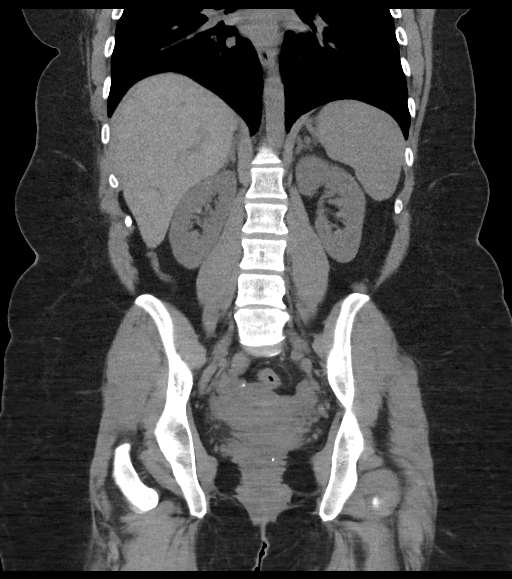

[17 of 46 positions shown; findings below may reference images not displayed]

FINDINGS: Lower chest: No acute abnormality.

Hepatobiliary: No focal liver abnormality is seen. Status post
cholecystectomy. Postoperative biliary dilatation.

Pancreas: Unremarkable. No pancreatic ductal dilatation or
surrounding inflammatory changes.

Spleen: Normal in size without significant abnormality.

Adrenals/Urinary Tract: Adrenal glands are unremarkable. Multiple
tiny bilateral nonobstructive renal calculi. No ureteral calculi or
hydronephrosis. Bladder is unremarkable.

Stomach/Bowel: Stomach is within normal limits. Appendix appears
normal. No evidence of bowel wall thickening, distention, or
inflammatory changes.

Vascular/Lymphatic: No significant vascular findings are present. No
enlarged abdominal or pelvic lymph nodes.

Reproductive: No mass or other significant abnormality. The right
ovary likely contains cysts or follicles, in keeping with appearance
on prior ultrasound.

Other: No abdominal wall hernia or abnormality. No abdominopelvic
ascites.

Musculoskeletal: No acute or significant osseous findings. Chronic
bilateral pars defects of L5.
IMPRESSION: Multiple tiny bilateral nonobstructive renal calculi. No ureteral
calculi or hydronephrosis.

## 2021-04-02 ENCOUNTER — Emergency Department (HOSPITAL_BASED_OUTPATIENT_CLINIC_OR_DEPARTMENT_OTHER): Payer: BC Managed Care – PPO

## 2021-04-02 ENCOUNTER — Emergency Department (HOSPITAL_BASED_OUTPATIENT_CLINIC_OR_DEPARTMENT_OTHER)
Admission: EM | Admit: 2021-04-02 | Discharge: 2021-04-02 | Disposition: A | Discharge status: Home / Self Care | Payer: BC Managed Care – PPO | Attending: Emergency Medicine | Admitting: Emergency Medicine

## 2021-04-02 ENCOUNTER — Other Ambulatory Visit: Payer: Self-pay

## 2021-04-02 ENCOUNTER — Encounter (HOSPITAL_BASED_OUTPATIENT_CLINIC_OR_DEPARTMENT_OTHER): Payer: Self-pay | Admitting: *Deleted

## 2021-04-02 DIAGNOSIS — M79606 Pain in leg, unspecified: Secondary | ICD-10-CM

## 2021-04-02 DIAGNOSIS — M7989 Other specified soft tissue disorders: Secondary | ICD-10-CM | POA: Insufficient documentation

## 2021-04-02 DIAGNOSIS — R609 Edema, unspecified: Secondary | ICD-10-CM

## 2021-04-02 DIAGNOSIS — Z87891 Personal history of nicotine dependence: Secondary | ICD-10-CM | POA: Diagnosis not present

## 2021-04-02 LAB — CBC WITH DIFFERENTIAL/PLATELET
Abs Immature Granulocytes: 0.02 10*3/uL (ref 0.00–0.07)
Basophils Absolute: 0 10*3/uL (ref 0.0–0.1)
Basophils Relative: 0 %
Eosinophils Absolute: 0.1 10*3/uL (ref 0.0–0.5)
Eosinophils Relative: 2 %
HCT: 37.8 % (ref 36.0–46.0)
Hemoglobin: 12.7 g/dL (ref 12.0–15.0)
Immature Granulocytes: 0 %
Lymphocytes Relative: 21 %
Lymphs Abs: 1.4 10*3/uL (ref 0.7–4.0)
MCH: 30 pg (ref 26.0–34.0)
MCHC: 33.6 g/dL (ref 30.0–36.0)
MCV: 89.4 fL (ref 80.0–100.0)
Monocytes Absolute: 0.5 10*3/uL (ref 0.1–1.0)
Monocytes Relative: 8 %
Neutro Abs: 4.7 10*3/uL (ref 1.7–7.7)
Neutrophils Relative %: 69 %
Platelets: 200 10*3/uL (ref 150–400)
RBC: 4.23 MIL/uL (ref 3.87–5.11)
RDW: 12.8 % (ref 11.5–15.5)
WBC: 6.8 10*3/uL (ref 4.0–10.5)
nRBC: 0 % (ref 0.0–0.2)

## 2021-04-02 LAB — BASIC METABOLIC PANEL
Anion gap: 5 (ref 5–15)
BUN: 15 mg/dL (ref 6–20)
CO2: 25 mmol/L (ref 22–32)
Calcium: 8.7 mg/dL — ABNORMAL LOW (ref 8.9–10.3)
Chloride: 109 mmol/L (ref 98–111)
Creatinine, Ser: 0.82 mg/dL (ref 0.44–1.00)
GFR, Estimated: >60 mL/min (ref >60)
Glucose, Bld: 97 mg/dL (ref 70–99)
Potassium: 4 mmol/L (ref 3.5–5.1)
Sodium: 139 mmol/L (ref 135–145)

## 2021-04-02 MED ORDER — FUROSEMIDE 20 MG PO TABS: 20.0000 mg | ORAL_TABLET | Freq: Every day | ORAL | 0 refills | Status: AC

## 2021-04-02 NOTE — ED Provider Notes (Signed)
[facility] EMERGENCY DEPARTMENT Provider Note   CSN: [id number] Arrival date & time: 04/02/21  2052     History Chief Complaint  Patient presents with   Leg Swelling    Molly Mcknight is a 35 y.o. female.  Pt reports she has swelling in her left leg and soreness in her left leg.  Pt reports she could not put her shoe on today due to swelling. Pt reports she has some swelling in her right leg but much more swelling in left leg.  Pt denies any heart problems or kidney disease.  Pt reports her Mother has lymphadema in her legs.   The history is provided by the patient. No language interpreter was used.      Past Medical History:  Diagnosis Date   Anemia    Chlamydia    PID (pelvic inflammatory disease)    Trichimoniasis     There are no problems to display for this patient.   Past Surgical History:  Procedure Laterality Date   AMPUTATION FINGER Right    CHOLECYSTECTOMY     TUBAL LIGATION       OB History     Gravida  3   Para  1   Term  1   Preterm  0   AB  1   Living  1      SAB  1   IAB  0   Ectopic  0   Multiple  0   Live Births  1           Family History  Problem Relation Age of Onset   Hypertension Mother    Diabetes Mother    Stroke Other    Diabetes Other    Hyperlipidemia Other     Social History   Tobacco Use   Smoking status: Former    Packs/day: 0.50    Types: Cigarettes    Quit date: 04/07/2020    Years since quitting: 0.9   Smokeless tobacco: Never  Vaping Use   Vaping Use: Former  Substance Use Topics   Alcohol use: Not Currently   Drug use: No    Home Medications Prior to Admission medications   Medication Sig Start Date End Date Taking? Authorizing Provider  diphenhydrAMINE (BENADRYL) 25 MG tablet Take 1 tablet (25 mg total) by mouth every 6 (six) hours as needed. 12/24/20   Horton, Mayer Masker, MD  famotidine (PEPCID) 20 MG tablet Take 1 tablet (20 mg total) by mouth daily. 12/24/20   Horton,  Mayer Masker, MD  predniSONE (DELTASONE) 10 MG tablet Take 4 tablets (40 mg total) by mouth daily. 12/24/20   Horton, Mayer Masker, MD  fluticasone (FLONASE) 50 MCG/ACT nasal spray Place 1 spray into both nostrils daily. 09/10/19 03/21/20  Tilden Fossa, MD    Allergies    Amoxicillin  Review of Systems   Review of Systems  All other systems reviewed and are negative.  Physical Exam Updated Vital Signs BP 112/77   Pulse 70   Temp 98.5 F (36.9 C) (Oral)   Resp 16   Ht 5\' 1"  (1.549 m)   Wt 99.8 kg   LMP 03/13/2021   SpO2 98%   BMI 41.57 kg/m   Physical Exam Vitals and nursing note reviewed.  Constitutional:      Appearance: She is well-developed.  HENT:     Head: Normocephalic.  Pulmonary:     Effort: Pulmonary effort is normal.  Abdominal:     General: There  is no distension.  Musculoskeletal:        General: Swelling and tenderness present. Normal range of motion.     Cervical back: Normal range of motion.     Comments: Wollen left lower leg, swollen left foot,  no sign of infection    Skin:    General: Skin is warm.  Neurological:     Mental Status: She is alert and oriented to person, place, and time.  Psychiatric:        Mood and Affect: Mood normal.    ED Results / Procedures / Treatments   Labs (all labs ordered are listed, but only abnormal results are displayed) Labs Reviewed  BASIC METABOLIC PANEL - Abnormal; Notable for the following components:      Result Value   Calcium 8.7 (*)    All other components within normal limits  CBC WITH DIFFERENTIAL/PLATELET    EKG None  Radiology US Venous Img Lower Unilateral Left (DVT)  Result Date: 04/02/2021 CLINICAL DATA:  Left lower extremity pain EXAM: LEFT LOWER EXTREMITY VENOUS DOPPLER ULTRASOUND TECHNIQUE: Gray-scale sonography with compression, as well as color and duplex ultrasound, were performed to evaluate the deep venous system(s) from the level of the common femoral vein through the popliteal and  proximal calf veins. COMPARISON:  None. FINDINGS: VENOUS Normal compressibility of the common femoral, superficial femoral, and popliteal veins, as well as the visualized calf veins. Visualized portions of profunda femoral vein and great saphenous vein unremarkable. No filling defects to suggest DVT on grayscale or color Doppler imaging. Doppler waveforms show normal direction of venous flow, normal respiratory plasticity and response to augmentation. Limited views of the contralateral common femoral vein are unremarkable. OTHER None. Limitations: none IMPRESSION: Negative. Electronically Signed   By: Charlett Nose M.D.   On: 04/02/2021 22:25    Procedures Procedures   Medications Ordered in ED Medications - No data to display  ED Course  I have reviewed the triage vital signs and the nursing notes.  Pertinent labs & imaging results that were available during my care of the patient were reviewed by me and considered in my medical decision making (see chart for details).    MDM Rules/Calculators/A&P                           MDM:  labs normal  Ultrasound no dvt.  I will give pt lasix x 3 days.  Pt advised compression hose.  Elevate legs.  Follow up with primary MD for recheck  Final Clinical Impression(s) / ED Diagnoses Final diagnoses:  Leg pain   An After Visit Summary was printed and given to the patient.  Rx / DC Orders ED Discharge Orders     None        Osie Cheeks 04/02/21 2250    Cheryll Cockayne, MD 04/05/21 1515

## 2021-04-02 NOTE — ED Triage Notes (Signed)
C/o left leg swelling and pain x 3 weeks

## 2021-04-02 NOTE — ED Notes (Signed)
Pt provided discharge instructions and prescription information. Pt was given the opportunity to ask questions and questions were answered. Discharge signature not obtained in the setting of the COVID-19 pandemic in order to reduce high touch surfaces.  ° °

## 2021-04-02 NOTE — Discharge Instructions (Addendum)
Return if any problems.  Elevate legs,  Avoid salt intake.  Use compression stockings

## 2021-05-18 ENCOUNTER — Emergency Department (HOSPITAL_COMMUNITY): Payer: BC Managed Care – PPO

## 2021-05-18 ENCOUNTER — Other Ambulatory Visit: Payer: Self-pay

## 2021-05-18 ENCOUNTER — Emergency Department (HOSPITAL_BASED_OUTPATIENT_CLINIC_OR_DEPARTMENT_OTHER): Payer: BC Managed Care – PPO

## 2021-05-18 ENCOUNTER — Emergency Department (HOSPITAL_BASED_OUTPATIENT_CLINIC_OR_DEPARTMENT_OTHER)
Admission: EM | Admit: 2021-05-18 | Discharge: 2021-05-19 | Disposition: A | Discharge status: Home / Self Care | Payer: BC Managed Care – PPO | Attending: Emergency Medicine | Admitting: Emergency Medicine

## 2021-05-18 ENCOUNTER — Encounter (HOSPITAL_BASED_OUTPATIENT_CLINIC_OR_DEPARTMENT_OTHER): Payer: Self-pay

## 2021-05-18 DIAGNOSIS — Z87891 Personal history of nicotine dependence: Secondary | ICD-10-CM | POA: Insufficient documentation

## 2021-05-18 DIAGNOSIS — N730 Acute parametritis and pelvic cellulitis: Secondary | ICD-10-CM | POA: Diagnosis not present

## 2021-05-18 DIAGNOSIS — R102 Pelvic and perineal pain: Secondary | ICD-10-CM

## 2021-05-18 DIAGNOSIS — R109 Unspecified abdominal pain: Secondary | ICD-10-CM | POA: Diagnosis present

## 2021-05-18 DIAGNOSIS — N200 Calculus of kidney: Secondary | ICD-10-CM | POA: Insufficient documentation

## 2021-05-18 DIAGNOSIS — A599 Trichomoniasis, unspecified: Secondary | ICD-10-CM | POA: Insufficient documentation

## 2021-05-18 DIAGNOSIS — N83201 Unspecified ovarian cyst, right side: Secondary | ICD-10-CM | POA: Insufficient documentation

## 2021-05-18 LAB — CBC WITH DIFFERENTIAL/PLATELET
Abs Immature Granulocytes: 0.02 10*3/uL (ref 0.00–0.07)
Basophils Absolute: 0 10*3/uL (ref 0.0–0.1)
Basophils Relative: 0 %
Eosinophils Absolute: 0.1 10*3/uL (ref 0.0–0.5)
Eosinophils Relative: 1 %
HCT: 36.9 % (ref 36.0–46.0)
Hemoglobin: 12.4 g/dL (ref 12.0–15.0)
Immature Granulocytes: 0 %
Lymphocytes Relative: 19 %
Lymphs Abs: 1.8 10*3/uL (ref 0.7–4.0)
MCH: 29.9 pg (ref 26.0–34.0)
MCHC: 33.6 g/dL (ref 30.0–36.0)
MCV: 88.9 fL (ref 80.0–100.0)
Monocytes Absolute: 0.7 10*3/uL (ref 0.1–1.0)
Monocytes Relative: 7 %
Neutro Abs: 6.8 10*3/uL (ref 1.7–7.7)
Neutrophils Relative %: 73 %
Platelets: 169 10*3/uL (ref 150–400)
RBC: 4.15 MIL/uL (ref 3.87–5.11)
RDW: 12.7 % (ref 11.5–15.5)
WBC: 9.5 10*3/uL (ref 4.0–10.5)
nRBC: 0 % (ref 0.0–0.2)

## 2021-05-18 LAB — COMPREHENSIVE METABOLIC PANEL
ALT: 13 U/L (ref 0–44)
AST: 12 U/L — ABNORMAL LOW (ref 15–41)
Albumin: 3.6 g/dL (ref 3.5–5.0)
Alkaline Phosphatase: 51 U/L (ref 38–126)
Anion gap: 5 (ref 5–15)
BUN: 13 mg/dL (ref 6–20)
CO2: 22 mmol/L (ref 22–32)
Calcium: 8.5 mg/dL — ABNORMAL LOW (ref 8.9–10.3)
Chloride: 108 mmol/L (ref 98–111)
Creatinine, Ser: 0.61 mg/dL (ref 0.44–1.00)
GFR, Estimated: >60 mL/min (ref >60)
Glucose, Bld: 106 mg/dL — ABNORMAL HIGH (ref 70–99)
Potassium: 3.8 mmol/L (ref 3.5–5.1)
Sodium: 135 mmol/L (ref 135–145)
Total Bilirubin: 0.1 mg/dL — ABNORMAL LOW (ref 0.3–1.2)
Total Protein: 6.3 g/dL — ABNORMAL LOW (ref 6.5–8.1)

## 2021-05-18 LAB — WET PREP, GENITAL
Clue Cells Wet Prep HPF POC: NONE SEEN
Sperm: NONE SEEN
Yeast Wet Prep HPF POC: NONE SEEN

## 2021-05-18 LAB — URINALYSIS, ROUTINE W REFLEX MICROSCOPIC
Bilirubin Urine: NEGATIVE
Glucose, UA: NEGATIVE mg/dL
Ketones, ur: NEGATIVE mg/dL
Nitrite: NEGATIVE
Protein, ur: NEGATIVE mg/dL
Specific Gravity, Urine: 1.03 (ref 1.005–1.030)
pH: 5 (ref 5.0–8.0)

## 2021-05-18 LAB — URINALYSIS, MICROSCOPIC (REFLEX)

## 2021-05-18 LAB — PREGNANCY, URINE: Preg Test, Ur: NEGATIVE

## 2021-05-18 MED ORDER — METRONIDAZOLE 500 MG PO TABS: 500.0000 mg | ORAL_TABLET | Freq: Two times a day (BID) | ORAL | 0 refills | Status: AC

## 2021-05-18 MED ORDER — CEFTRIAXONE SODIUM 500 MG IJ SOLR
500.0000 mg | Freq: Once | INTRAMUSCULAR | Status: AC
  Administered 2021-05-18: 500 mg via INTRAMUSCULAR
  Filled 2021-05-18: qty 500

## 2021-05-18 MED ORDER — DOXYCYCLINE HYCLATE 100 MG PO CAPS: 100.0000 mg | ORAL_CAPSULE | Freq: Two times a day (BID) | ORAL | 0 refills | Status: AC

## 2021-05-18 NOTE — ED Notes (Signed)
Pelvic cart outside of exam room.

## 2021-05-18 NOTE — ED Provider Notes (Signed)
[facility] EMERGENCY DEPARTMENT Provider Note   CSN: [id number] Arrival date & time: 05/18/21  1853     History Chief Complaint  Patient presents with   Flank Pain    Molly Mcknight is a 35 y.o. female.  HPI Patient is a 35 year old female with a medical history as noted below.  She presents to the emergency department due to right flank pain that started 3 days ago.  Patient states while at work she began experiencing right flank pain.  It was initially intermittent and began worsening and radiating to her groin.  She states that when this occurred she began experiencing vaginal discharge.  She also notes associated urinary frequency but no dysuria.  Denies any fevers, chills, nausea, vomiting, diarrhea.  Reports a history of tubal ligation.  Reports a history of unprotected sex with 1 female partner in the past 6 months.    Past Medical History:  Diagnosis Date   Anemia    Chlamydia    PID (pelvic inflammatory disease)    Trichimoniasis     There are no problems to display for this patient.   Past Surgical History:  Procedure Laterality Date   AMPUTATION FINGER Right    CHOLECYSTECTOMY     TUBAL LIGATION       OB History     Gravida  3   Para  1   Term  1   Preterm  0   AB  1   Living  1      SAB  1   IAB  0   Ectopic  0   Multiple  0   Live Births  1           Family History  Problem Relation Age of Onset   Hypertension Mother    Diabetes Mother    Stroke Other    Diabetes Other    Hyperlipidemia Other     Social History   Tobacco Use   Smoking status: Former    Packs/day: 0.50    Types: Cigarettes    Quit date: 04/07/2020    Years since quitting: 1.1   Smokeless tobacco: Never  Vaping Use   Vaping Use: Former  Substance Use Topics   Alcohol use: Not Currently   Drug use: No    Home Medications Prior to Admission medications   Medication Sig Start Date End Date Taking? Authorizing Provider  diphenhydrAMINE  (BENADRYL) 25 MG tablet Take 1 tablet (25 mg total) by mouth every 6 (six) hours as needed. 12/24/20   Horton, Mayer Masker, MD  famotidine (PEPCID) 20 MG tablet Take 1 tablet (20 mg total) by mouth daily. 12/24/20   Horton, Mayer Masker, MD  furosemide (LASIX) 20 MG tablet Take 1 tablet (20 mg total) by mouth daily. 04/02/21 04/02/22  Elson Areas, PA-C  predniSONE (DELTASONE) 10 MG tablet Take 4 tablets (40 mg total) by mouth daily. 12/24/20   Horton, Mayer Masker, MD  fluticasone (FLONASE) 50 MCG/ACT nasal spray Place 1 spray into both nostrils daily. 09/10/19 03/21/20  Tilden Fossa, MD    Allergies    Amoxicillin  Review of Systems   Review of Systems  All other systems reviewed and are negative. Ten systems reviewed and are negative for acute change, except as noted in the HPI.   Physical Exam Updated Vital Signs BP 109/69   Pulse 60   Temp 98.4 F (36.9 C) (Oral)   Resp 18   Ht 5\' 1"  (1.549 m)  Wt 99.8 kg   LMP 05/11/2021   SpO2 100%   BMI 41.57 kg/m   Physical Exam Vitals and nursing note reviewed.  Constitutional:      General: She is not in acute distress.    Appearance: Normal appearance. She is not ill-appearing, toxic-appearing or diaphoretic.  HENT:     Head: Normocephalic and atraumatic.     Right Ear: External ear normal.     Left Ear: External ear normal.     Nose: Nose normal.     Mouth/Throat:     Mouth: Mucous membranes are moist.     Pharynx: Oropharynx is clear. No oropharyngeal exudate or posterior oropharyngeal erythema.  Eyes:     Extraocular Movements: Extraocular movements intact.  Cardiovascular:     Rate and Rhythm: Normal rate and regular rhythm.     Pulses: Normal pulses.     Heart sounds: Normal heart sounds. No murmur heard.   No friction rub. No gallop.  Pulmonary:     Effort: Pulmonary effort is normal. No respiratory distress.     Breath sounds: Normal breath sounds. No stridor. No wheezing, rhonchi or rales.  Abdominal:     General:  Abdomen is flat.     Palpations: Abdomen is soft.     Tenderness: There is abdominal tenderness. There is right CVA tenderness. There is no left CVA tenderness.     Comments: Protuberant abdomen that is soft.  Moderate tenderness noted in the suprapubic region.  Additional moderate tenderness noted along the central right lateral abdomen.  Moderate right CVA tenderness.  No left CVA tenderness.  Genitourinary:    Comments: Female nursing chaperone present.  Normal-appearing vulvar anatomy.  Normal-appearing vaginal mucosa.  Moderate amount of green/yellow discharge noted in the vaginal vault.  Closed cervical os.  Positive cervical motion tenderness.  Mild right adnexal tenderness.  No left adnexal tenderness. Musculoskeletal:        General: Normal range of motion.     Cervical back: Normal range of motion and neck supple. No tenderness.  Skin:    General: Skin is warm and dry.  Neurological:     General: No focal deficit present.     Mental Status: She is alert and oriented to person, place, and time.  Psychiatric:        Mood and Affect: Mood normal.        Behavior: Behavior normal.   ED Results / Procedures / Treatments   Labs (all labs ordered are listed, but only abnormal results are displayed) Labs Reviewed  WET PREP, GENITAL - Abnormal; Notable for the following components:      Result Value   Trich, Wet Prep PRESENT (*)    WBC, Wet Prep HPF POC MANY (*)    All other components within normal limits  URINALYSIS, ROUTINE W REFLEX MICROSCOPIC - Abnormal; Notable for the following components:   APPearance HAZY (*)    Hgb urine dipstick MODERATE (*)    Leukocytes,Ua MODERATE (*)    All other components within normal limits  URINALYSIS, MICROSCOPIC (REFLEX) - Abnormal; Notable for the following components:   Bacteria, UA MANY (*)    Trichomonas, UA PRESENT (*)    All other components within normal limits  COMPREHENSIVE METABOLIC PANEL - Abnormal; Notable for the following  components:   Glucose, Bld 106 (*)    Calcium 8.5 (*)    Total Protein 6.3 (*)    AST 12 (*)    Total Bilirubin 0.1 (*)  All other components within normal limits  PREGNANCY, URINE  CBC WITH DIFFERENTIAL/PLATELET  GC/CHLAMYDIA PROBE AMP ([place]) NOT AT [facility]   EKG None  Radiology CT Renal Stone Study  Result Date: 05/18/2021 CLINICAL DATA:  Flank pain, kidney stone suspected. Right flank pain EXAM: CT ABDOMEN AND PELVIS WITHOUT CONTRAST TECHNIQUE: Multidetector CT imaging of the abdomen and pelvis was performed following the standard protocol without IV contrast. COMPARISON:  01/26/2020 FINDINGS: Lower chest: Lung bases are clear. No effusions. Heart is normal size. Hepatobiliary: No focal liver abnormality is seen. Status post cholecystectomy. No biliary dilatation. Pancreas: No focal abnormality or ductal dilatation. Spleen: No focal abnormality.  Normal size. Adrenals/Urinary Tract: Punctate nonobstructing stones in the lower poles of both kidneys. No ureteral stones or hydronephrosis. Adrenal glands and urinary bladder unremarkable. Stomach/Bowel: Normal appendix. Stomach, large and small bowel grossly unremarkable. Vascular/Lymphatic: No evidence of aneurysm or adenopathy. Reproductive: Prominent right ovary possibly containing a cyst measuring up to 5 cm. Prominent left ovary may also contains cyst, measuring up to 4 cm. Uterus unremarkable. Other: No free fluid or free air. Musculoskeletal: No acute bony abnormality. IMPRESSION: Punctate nonobstructing bilateral nephrolithiasis. No ureteral stones or hydronephrosis. Normal appendix. Prominent ovaries bilaterally, right greater than left which may contain cysts. Electronically Signed   By: Charlett Nose M.D.   On: 05/18/2021 21:25    Procedures Procedures   Medications Ordered in ED Medications - No data to display  ED Course  I have reviewed the triage vital signs and the nursing notes.  Pertinent labs & imaging results that  were available during my care of the patient were reviewed by me and considered in my medical decision making (see chart for details).    MDM Rules/Calculators/A&P                          Pt is a 35 y.o. female who presents to the emergency department due to right flank pain, pelvic pain, vaginal discharge.  Labs: CBC without abnormalities. CMP with a glucose of 106, calcium of 8.5, total protein of 6.3, AST of 12, total bilirubin 0.1. UA showing moderate hemoglobin, moderate leukocytes, many bacteria, trichomonas. Wet prep showing trichomonas as well as many white blood cells. GC/chlamydia is pending.  Imaging: CT renal stone study shows punctate nonobstructing bilateral nephrolithiasis.  No ureteral stones or hydronephrosis.  Normal appendix.  Prominent ovaries bilaterally, right greater than left which may contain cysts.  I, Placido Sou, PA-C, personally reviewed and evaluated these images and lab results as part of my medical decision-making.  Physical exam concerning for PID.  Wet prep and UA positive for trichomonas.  GC/chlamydia is pending.  Patient had a moderate amount of green/yellow discharge noted in the vaginal vault.  Positive cervical motion tenderness.  Patient additionally had some moderate right adnexal tenderness.  CT scan concerning for prominent ovaries, right greater than left.  We unfortunately do not have ultrasound available on this campus at this time of night.  Discussed transfer to [facility] for pelvic ultrasound and patient was amenable.  Case discussed with Dr. Rubin Payor in the [facility] emergency department who will accept patient for transfer.  She is stable for transfer via POV.  Will treat patient for PID.  She was given a dose of IM Rocephin in the emergency department.  She was given prescriptions for doxycycline as well as Flagyl.  Patient given address and information for the [facility] emergency department and discharged  from our ED in stable  condition.   Note: Portions of this report may have been transcribed using voice recognition software. Every effort was made to ensure accuracy; however, inadvertent computerized transcription errors may be present.   Final Clinical Impression(s) / ED Diagnoses Final diagnoses:  PID (acute pelvic inflammatory disease)  Trichomonas infection  Right adnexal tenderness   Rx / DC Orders ED Discharge Orders     None        Placido Sou, PA-C 05/18/21 2231    Charlynne Pander, MD 05/21/21 1504

## 2021-05-18 NOTE — Discharge Instructions (Addendum)
I prescribed you 2 additional antibiotics.  The first is called doxycycline.  The second is called metronidazole.  Please take both of these antibiotics twice a day for the next 14 days.  Do not stop taking these antibiotics early even if you feel that your symptoms have resolved.  Please go to the [facility] emergency department as soon as possible for your ultrasound.  Your case has been discussed with Dr. Rubin Payor.  You can tell them that you were transferred over from med [facility] by Placido Sou, PA-C for a pelvic ultrasound.   If you develop any worsening symptoms or have difficulty finding the [facility] emergency department please call our emergency department or return to [facility] for further evaluation.

## 2021-05-18 NOTE — ED Triage Notes (Signed)
Reports sharp right flank pain that been going on since Wednesday.  Noticed vaginal discharge since with some vaginal pain as well.  Able to eat and drink okay.

## 2021-05-18 NOTE — ED Notes (Signed)
Patient transported to CT 

## 2021-05-18 NOTE — ED Notes (Signed)
Pelvic set up, PA made aware

## 2021-05-19 NOTE — ED Notes (Signed)
Pt stated she was leaving AMA 

## 2021-05-20 LAB — GC/CHLAMYDIA PROBE AMP (~~LOC~~) NOT AT ARMC
Chlamydia: NEGATIVE
Comment: NEGATIVE
Comment: NORMAL
Neisseria Gonorrhea: NEGATIVE

## 2021-10-21 ENCOUNTER — Encounter (HOSPITAL_BASED_OUTPATIENT_CLINIC_OR_DEPARTMENT_OTHER): Payer: Self-pay | Admitting: Emergency Medicine

## 2021-10-21 ENCOUNTER — Other Ambulatory Visit: Payer: Self-pay

## 2021-10-21 ENCOUNTER — Emergency Department (HOSPITAL_BASED_OUTPATIENT_CLINIC_OR_DEPARTMENT_OTHER)
Admission: EM | Admit: 2021-10-21 | Discharge: 2021-10-21 | Disposition: A | Discharge status: Home / Self Care | Payer: BC Managed Care – PPO | Attending: Emergency Medicine | Admitting: Emergency Medicine

## 2021-10-21 DIAGNOSIS — R102 Pelvic and perineal pain: Secondary | ICD-10-CM

## 2021-10-21 DIAGNOSIS — N3090 Cystitis, unspecified without hematuria: Secondary | ICD-10-CM | POA: Diagnosis not present

## 2021-10-21 LAB — URINALYSIS, ROUTINE W REFLEX MICROSCOPIC
Bilirubin Urine: NEGATIVE
Glucose, UA: NEGATIVE mg/dL
Hgb urine dipstick: NEGATIVE
Ketones, ur: NEGATIVE mg/dL
Nitrite: NEGATIVE
Protein, ur: NEGATIVE mg/dL
Specific Gravity, Urine: 1.025 (ref 1.005–1.030)
pH: 5.5 (ref 5.0–8.0)

## 2021-10-21 LAB — URINALYSIS, MICROSCOPIC (REFLEX)

## 2021-10-21 LAB — WET PREP, GENITAL
Clue Cells Wet Prep HPF POC: NONE SEEN
Sperm: NONE SEEN
Trich, Wet Prep: NONE SEEN
WBC, Wet Prep HPF POC: >=10 — AB (ref <10)
Yeast Wet Prep HPF POC: NONE SEEN

## 2021-10-21 LAB — PREGNANCY, URINE: Preg Test, Ur: NEGATIVE

## 2021-10-21 MED ORDER — NITROFURANTOIN MONOHYD MACRO 100 MG PO CAPS: 100.0000 mg | ORAL_CAPSULE | Freq: Two times a day (BID) | ORAL | 0 refills | Status: DC

## 2021-10-21 NOTE — ED Triage Notes (Signed)
Pelvic, lower back and vaginal pain, painful urination. Painful period last has been painful since, gradually worse.

## 2021-10-21 NOTE — ED Provider Notes (Signed)
[facility] EMERGENCY DEPARTMENT Provider Note   CSN: [id number] Arrival date & time: 10/21/21  0418     History  Chief Complaint  Patient presents with   Hip Pain   Back Pain   Vaginal Pain    Molly Mcknight is a 36 y.o. female.  The history is provided by the patient and medical records.  Hip Pain  Back Pain Vaginal Pain Molly Mcknight is a 36 y.o. female who presents to the Emergency Department complaining of pelvic pain.  She presents to the emergency department complaining of 1 week of pelvic pain that radiates to her low back.  She has experienced associated dysuria and a sensation that she needs to push to urinate and has pain at the end of urination.  Symptoms worsened tonight, which prompted emergency department visit.  She took naproxen at home with only mild improvement in symptoms.   No fever, vomiting, nausea.  No vaginal discharge.   Sexually active, no new partners.  Lmp last week.  Normal cycle.  No med, no meds     Home Medications Prior to Admission medications   Medication Sig Start Date End Date Taking? Authorizing Provider  nitrofurantoin, macrocrystal-monohydrate, (MACROBID) 100 MG capsule Take 1 capsule (100 mg total) by mouth 2 (two) times daily. 10/21/21  Yes Tilden Fossa, MD  diphenhydrAMINE (BENADRYL) 25 MG tablet Take 1 tablet (25 mg total) by mouth every 6 (six) hours as needed. 12/24/20   Horton, Mayer Masker, MD  famotidine (PEPCID) 20 MG tablet Take 1 tablet (20 mg total) by mouth daily. 12/24/20   Horton, Mayer Masker, MD  furosemide (LASIX) 20 MG tablet Take 1 tablet (20 mg total) by mouth daily. 04/02/21 04/02/22  Elson Areas, PA-C  predniSONE (DELTASONE) 10 MG tablet Take 4 tablets (40 mg total) by mouth daily. 12/24/20   Horton, Mayer Masker, MD  fluticasone (FLONASE) 50 MCG/ACT nasal spray Place 1 spray into both nostrils daily. 09/10/19 03/21/20  Tilden Fossa, MD      Allergies    Amoxicillin    Review of Systems   Review of  Systems  Genitourinary:  Positive for vaginal pain.  Musculoskeletal:  Positive for back pain.  All other systems reviewed and are negative.  Physical Exam Updated Vital Signs BP 114/75 (BP Location: Right Arm)    Pulse 75    Temp 98.1 F (36.7 C) (Oral)    Resp 18    Ht 5\' 1"  (1.549 m)    Wt 77.1 kg    LMP 10/09/2021 (Approximate)    SpO2 99%    BMI 32.10 kg/m  Physical Exam Vitals and nursing note reviewed.  Constitutional:      Appearance: She is well-developed.  HENT:     Head: Normocephalic and atraumatic.  Cardiovascular:     Rate and Rhythm: Normal rate and regular rhythm.  Pulmonary:     Effort: Pulmonary effort is normal. No respiratory distress.  Abdominal:     Palpations: Abdomen is soft.     Tenderness: There is no guarding or rebound.     Comments: Mild suprapubic tenderness  Genitourinary:    Comments: No significant vaginal discharge.  Os closed.  No CMT. Musculoskeletal:        General: No tenderness.  Skin:    General: Skin is warm and dry.  Neurological:     Mental Status: She is alert and oriented to person, place, and time.  Psychiatric:  Behavior: Behavior normal.    ED Results / Procedures / Treatments   Labs (all labs ordered are listed, but only abnormal results are displayed) Labs Reviewed  WET PREP, GENITAL - Abnormal; Notable for the following components:      Result Value   WBC, Wet Prep HPF POC >=10 (*)    All other components within normal limits  URINALYSIS, ROUTINE W REFLEX MICROSCOPIC - Abnormal; Notable for the following components:   APPearance CLOUDY (*)    Leukocytes,Ua TRACE (*)    All other components within normal limits  URINALYSIS, MICROSCOPIC (REFLEX) - Abnormal; Notable for the following components:   Bacteria, UA MANY (*)    All other components within normal limits  PREGNANCY, URINE  GC/CHLAMYDIA PROBE AMP ([facility]) NOT AT [facility]    EKG None  Radiology No results found.  Procedures Procedures     Medications Ordered in ED Medications - No data to display  ED Course/ Medical Decision Making/ A&P                           Medical Decision Making Amount and/or Complexity of Data Reviewed Labs: ordered.  Risk Prescription drug management.   Patient here for evaluation of pelvic pain, dysuria.  She has suprapubic tenderness on examination, no peritoneal findings.  Pelvic examination is benign and not consistent with PID or tubo-ovarian abscess or torsion.  Doubt obstructing kidney stone or appendicitis.  Urinalysis does have skin contaminant but in setting of symptoms we will treat for urinary tract infection/cystitis.  Discussed with patient home care for cystitis, pelvic pain with outpatient follow-up and return precautions.        Final Clinical Impression(s) / ED Diagnoses Final diagnoses:  Cystitis  Pelvic pain    Rx / DC Orders ED Discharge Orders          Ordered    nitrofurantoin, macrocrystal-monohydrate, (MACROBID) 100 MG capsule  2 times daily        10/21/21 0554              Tilden Fossa, MD 10/21/21 [phone]

## 2021-10-22 LAB — GC/CHLAMYDIA PROBE AMP (~~LOC~~) NOT AT ARMC
Chlamydia: NEGATIVE
Comment: NEGATIVE
Comment: NORMAL
Neisseria Gonorrhea: NEGATIVE

## 2022-06-18 ENCOUNTER — Emergency Department (HOSPITAL_BASED_OUTPATIENT_CLINIC_OR_DEPARTMENT_OTHER)
Admission: EM | Admit: 2022-06-18 | Discharge: 2022-06-18 | Discharge status: Against Medical Advice | Payer: Self-pay | Attending: Emergency Medicine | Admitting: Emergency Medicine

## 2022-06-18 ENCOUNTER — Encounter (HOSPITAL_BASED_OUTPATIENT_CLINIC_OR_DEPARTMENT_OTHER): Payer: Self-pay

## 2022-06-18 ENCOUNTER — Other Ambulatory Visit: Payer: Self-pay

## 2022-06-18 DIAGNOSIS — N898 Other specified noninflammatory disorders of vagina: Secondary | ICD-10-CM | POA: Insufficient documentation

## 2022-06-18 DIAGNOSIS — Z5321 Procedure and treatment not carried out due to patient leaving prior to being seen by health care provider: Secondary | ICD-10-CM | POA: Insufficient documentation

## 2022-06-18 DIAGNOSIS — M545 Low back pain, unspecified: Secondary | ICD-10-CM | POA: Insufficient documentation

## 2022-06-18 DIAGNOSIS — R300 Dysuria: Secondary | ICD-10-CM | POA: Insufficient documentation

## 2022-06-18 LAB — URINALYSIS, ROUTINE W REFLEX MICROSCOPIC
Bilirubin Urine: NEGATIVE
Glucose, UA: NEGATIVE mg/dL
Hgb urine dipstick: NEGATIVE
Ketones, ur: NEGATIVE mg/dL
Leukocytes,Ua: NEGATIVE
Nitrite: NEGATIVE
Protein, ur: NEGATIVE mg/dL
Specific Gravity, Urine: 1.02 (ref 1.005–1.030)
pH: 7 (ref 5.0–8.0)

## 2022-06-18 LAB — PREGNANCY, URINE: Preg Test, Ur: NEGATIVE

## 2022-06-18 NOTE — ED Triage Notes (Signed)
Reports pain with urination x 2 days.  Also complains of lower back pain.  Reports noticed a vaginal discharge that is whitish yellow.  Denies vaginal bleeding denies new sexual partners.

## 2022-08-31 ENCOUNTER — Emergency Department (HOSPITAL_BASED_OUTPATIENT_CLINIC_OR_DEPARTMENT_OTHER)
Admission: EM | Admit: 2022-08-31 | Discharge: 2022-08-31 | Disposition: A | Discharge status: Home / Self Care | Payer: Self-pay | Attending: Emergency Medicine | Admitting: Emergency Medicine

## 2022-08-31 ENCOUNTER — Encounter (HOSPITAL_BASED_OUTPATIENT_CLINIC_OR_DEPARTMENT_OTHER): Payer: Self-pay | Admitting: Urology

## 2022-08-31 ENCOUNTER — Other Ambulatory Visit: Payer: Self-pay

## 2022-08-31 DIAGNOSIS — Z1152 Encounter for screening for COVID-19: Secondary | ICD-10-CM | POA: Insufficient documentation

## 2022-08-31 DIAGNOSIS — J101 Influenza due to other identified influenza virus with other respiratory manifestations: Secondary | ICD-10-CM

## 2022-08-31 DIAGNOSIS — J069 Acute upper respiratory infection, unspecified: Secondary | ICD-10-CM | POA: Insufficient documentation

## 2022-08-31 LAB — RESP PANEL BY RT-PCR (RSV, FLU A&B, COVID)  RVPGX2
Influenza A by PCR: POSITIVE — AB
Influenza B by PCR: NEGATIVE
Resp Syncytial Virus by PCR: NEGATIVE
SARS Coronavirus 2 by RT PCR: NEGATIVE

## 2022-08-31 MED ORDER — BENZONATATE 200 MG PO CAPS: 200.0000 mg | ORAL_CAPSULE | Freq: Three times a day (TID) | ORAL | 0 refills | Status: AC

## 2022-08-31 NOTE — ED Provider Notes (Signed)
[facility] EMERGENCY DEPARTMENT Provider Note   CSN: [id number] Arrival date & time: 08/31/22  1649     History  Chief Complaint  Patient presents with   Nasal Congestion    Molly Mcknight is a 37 y.o. female.  37 yo female with congestion, cough, body aches, sore throat onset 2-3 days ago. Exposed to dad who got better, then mom got admitted for flu this morning. Otherwise healthy. Former smoker.        Home Medications Prior to Admission medications   Medication Sig Start Date End Date Taking? Authorizing Provider  benzonatate (TESSALON) 200 MG capsule Take 1 capsule (200 mg total) by mouth every 8 (eight) hours for 10 days. 08/31/22 09/10/22 Yes Jeannie Fend, PA-C  diphenhydrAMINE (BENADRYL) 25 MG tablet Take 1 tablet (25 mg total) by mouth every 6 (six) hours as needed. 12/24/20   Horton, Mayer Masker, MD  famotidine (PEPCID) 20 MG tablet Take 1 tablet (20 mg total) by mouth daily. 12/24/20   Horton, Mayer Masker, MD  furosemide (LASIX) 20 MG tablet Take 1 tablet (20 mg total) by mouth daily. 04/02/21 04/02/22  Elson Areas, PA-C  nitrofurantoin, macrocrystal-monohydrate, (MACROBID) 100 MG capsule Take 1 capsule (100 mg total) by mouth 2 (two) times daily. 10/21/21   Tilden Fossa, MD  predniSONE (DELTASONE) 10 MG tablet Take 4 tablets (40 mg total) by mouth daily. 12/24/20   Horton, Mayer Masker, MD  fluticasone (FLONASE) 50 MCG/ACT nasal spray Place 1 spray into both nostrils daily. 09/10/19 03/21/20  Tilden Fossa, MD      Allergies    Amoxicillin and Penicillins    Review of Systems   Review of Systems Negative except as per HPI Physical Exam Updated Vital Signs BP (!) 131/92 (BP Location: Left Arm)   Pulse (!) 58   Temp 98.3 F (36.8 C) (Oral)   Resp 20   Ht 5\' 1"  (1.549 m)   Wt 63.5 kg   SpO2 100%   BMI 26.45 kg/m  Physical Exam Vitals and nursing note reviewed.  Constitutional:      General: She is not in acute distress.    Appearance: She is  well-developed. She is not diaphoretic.  HENT:     Head: Normocephalic and atraumatic.     Nose: Congestion present.  Eyes:     Conjunctiva/sclera: Conjunctivae normal.  Cardiovascular:     Rate and Rhythm: Normal rate and regular rhythm.     Heart sounds: Normal heart sounds.  Pulmonary:     Effort: Pulmonary effort is normal.     Breath sounds: Normal breath sounds.  Musculoskeletal:     Cervical back: Neck supple.  Lymphadenopathy:     Cervical: No cervical adenopathy.  Skin:    General: Skin is warm and dry.     Findings: No erythema or rash.  Neurological:     Mental Status: She is alert and oriented to person, place, and time.  Psychiatric:        Behavior: Behavior normal.     ED Results / Procedures / Treatments   Labs (all labs ordered are listed, but only abnormal results are displayed) Labs Reviewed  RESP PANEL BY RT-PCR (RSV, FLU A&B, COVID)  [id number] - Abnormal; Notable for the following components:      Result Value   Influenza A by PCR POSITIVE (*)    All other components within normal limits    EKG None  Radiology No results found.  Procedures Procedures  Medications Ordered in ED Medications - No data to display  ED Course/ Medical Decision Making/ A&P                           Medical Decision Making Risk Prescription drug management.   37 year old female presents emergency room with URI symptoms as above.  On exam, has mild nasal congestion, appears to feel unwell although nontoxic and in no distress.  Vitals reviewed and reassuring.  Suspect likely viral illness.  Patient is discharged and advised to follow-up on her MyChart account for her COVID/flu/RSV test results.  As viral illness at this time, symptomatic management, if not improving, may progress to bacterial infection down the road (in regards to her sinus pressure and discomfort).  Patient requested to wait in the lobby following discharge to verbally receive her viral panel  results.  When results became available, I was unable to locate the patient in the lobby.  I did attempt to contact patient on the phone number on file.  Left a nonspecific voicemail asking for call back to discuss results.  Is positive for influenza.        Final Clinical Impression(s) / ED Diagnoses Final diagnoses:  Upper respiratory tract infection, unspecified type  Influenza A    Rx / DC Orders ED Discharge Orders          Ordered    benzonatate (TESSALON) 200 MG capsule  Every 8 hours        08/31/22 1751              Tacy Learn, PA-C 08/31/22 1948    Tretha Sciara, MD 09/04/22 1458

## 2022-08-31 NOTE — ED Notes (Signed)
ED Provider at bedside. 

## 2022-08-31 NOTE — ED Triage Notes (Signed)
Pt states sinus pressure, cough , sore throat, pain with deep breathing, body aches x 2 days  Denies Fever   Pt mom is Flu+

## 2022-08-31 NOTE — Discharge Instructions (Addendum)
Motrin and Tylenol as needed as directed.  Mucinex as needed as directed. Tessalon as needed as prescribed. Saline sinus rinse, zyrtec and flonase as directed.

## 2022-12-21 ENCOUNTER — Other Ambulatory Visit: Payer: Self-pay

## 2022-12-21 ENCOUNTER — Encounter (HOSPITAL_BASED_OUTPATIENT_CLINIC_OR_DEPARTMENT_OTHER): Payer: Self-pay

## 2022-12-21 DIAGNOSIS — N390 Urinary tract infection, site not specified: Secondary | ICD-10-CM | POA: Insufficient documentation

## 2022-12-21 DIAGNOSIS — N760 Acute vaginitis: Secondary | ICD-10-CM | POA: Insufficient documentation

## 2022-12-21 DIAGNOSIS — B9689 Other specified bacterial agents as the cause of diseases classified elsewhere: Secondary | ICD-10-CM | POA: Insufficient documentation

## 2022-12-21 DIAGNOSIS — Z87891 Personal history of nicotine dependence: Secondary | ICD-10-CM | POA: Insufficient documentation

## 2022-12-21 LAB — COMPREHENSIVE METABOLIC PANEL
ALT: 11 U/L (ref 0–44)
AST: 13 U/L — ABNORMAL LOW (ref 15–41)
Albumin: 3.4 g/dL — ABNORMAL LOW (ref 3.5–5.0)
Alkaline Phosphatase: 37 U/L — ABNORMAL LOW (ref 38–126)
Anion gap: 7 (ref 5–15)
BUN: 21 mg/dL — ABNORMAL HIGH (ref 6–20)
CO2: 22 mmol/L (ref 22–32)
Calcium: 8.3 mg/dL — ABNORMAL LOW (ref 8.9–10.3)
Chloride: 112 mmol/L — ABNORMAL HIGH (ref 98–111)
Creatinine, Ser: 0.74 mg/dL (ref 0.44–1.00)
GFR, Estimated: >60 mL/min (ref >60)
Glucose, Bld: 116 mg/dL — ABNORMAL HIGH (ref 70–99)
Potassium: 3.8 mmol/L (ref 3.5–5.1)
Sodium: 141 mmol/L (ref 135–145)
Total Bilirubin: 0.3 mg/dL (ref 0.3–1.2)
Total Protein: 6.1 g/dL — ABNORMAL LOW (ref 6.5–8.1)

## 2022-12-21 LAB — CBC WITH DIFFERENTIAL/PLATELET
Abs Immature Granulocytes: 0.02 10*3/uL (ref 0.00–0.07)
Basophils Absolute: 0 10*3/uL (ref 0.0–0.1)
Basophils Relative: 0 %
Eosinophils Absolute: 0.1 10*3/uL (ref 0.0–0.5)
Eosinophils Relative: 1 %
HCT: 33.2 % — ABNORMAL LOW (ref 36.0–46.0)
Hemoglobin: 10.9 g/dL — ABNORMAL LOW (ref 12.0–15.0)
Immature Granulocytes: 0 %
Lymphocytes Relative: 16 %
Lymphs Abs: 0.7 10*3/uL (ref 0.7–4.0)
MCH: 29.7 pg (ref 26.0–34.0)
MCHC: 32.8 g/dL (ref 30.0–36.0)
MCV: 90.5 fL (ref 80.0–100.0)
Monocytes Absolute: 0.4 10*3/uL (ref 0.1–1.0)
Monocytes Relative: 8 %
Neutro Abs: 3.5 10*3/uL (ref 1.7–7.7)
Neutrophils Relative %: 75 %
Platelets: 122 10*3/uL — ABNORMAL LOW (ref 150–400)
RBC: 3.67 MIL/uL — ABNORMAL LOW (ref 3.87–5.11)
RDW: 12.9 % (ref 11.5–15.5)
WBC: 4.7 10*3/uL (ref 4.0–10.5)
nRBC: 0 % (ref 0.0–0.2)

## 2022-12-21 LAB — URINALYSIS, ROUTINE W REFLEX MICROSCOPIC
Bilirubin Urine: NEGATIVE
Glucose, UA: NEGATIVE mg/dL
Hgb urine dipstick: NEGATIVE
Ketones, ur: NEGATIVE mg/dL
Nitrite: NEGATIVE
Protein, ur: NEGATIVE mg/dL
Specific Gravity, Urine: >=1.03 (ref 1.005–1.030)
pH: 6 (ref 5.0–8.0)

## 2022-12-21 LAB — PREGNANCY, URINE: Preg Test, Ur: NEGATIVE

## 2022-12-21 LAB — URINALYSIS, MICROSCOPIC (REFLEX)

## 2022-12-21 NOTE — ED Triage Notes (Addendum)
Pt has had Vaginal discharge with Lower ABD pain, back pain with dysuria.  Pt wants to be tested for STIs.  Onset Saturday.  Pt states she has is also constipated which is an ongoing problem.

## 2022-12-22 ENCOUNTER — Emergency Department (HOSPITAL_BASED_OUTPATIENT_CLINIC_OR_DEPARTMENT_OTHER)
Admission: EM | Admit: 2022-12-22 | Discharge: 2022-12-22 | Disposition: A | Discharge status: Home / Self Care | Payer: Self-pay | Attending: Emergency Medicine | Admitting: Emergency Medicine

## 2022-12-22 DIAGNOSIS — N390 Urinary tract infection, site not specified: Secondary | ICD-10-CM

## 2022-12-22 DIAGNOSIS — N760 Acute vaginitis: Secondary | ICD-10-CM

## 2022-12-22 LAB — WET PREP, GENITAL
Sperm: NONE SEEN
Trich, Wet Prep: NONE SEEN
WBC, Wet Prep HPF POC: >=10 — AB (ref <10)
Yeast Wet Prep HPF POC: NONE SEEN

## 2022-12-22 LAB — HIV ANTIBODY (ROUTINE TESTING W REFLEX): HIV Screen 4th Generation wRfx: NONREACTIVE

## 2022-12-22 MED ORDER — METRONIDAZOLE 500 MG PO TABS: 500.0000 mg | ORAL_TABLET | Freq: Two times a day (BID) | ORAL | 0 refills | Status: DC

## 2022-12-22 MED ORDER — FOSFOMYCIN TROMETHAMINE 3 G PO PACK
3.0000 g | PACK | Freq: Once | ORAL | Status: AC
  Administered 2022-12-22: 3 g via ORAL
  Filled 2022-12-22: qty 3

## 2022-12-22 NOTE — ED Provider Notes (Signed)
[facility] Provider Note: Molly Dell, MD, FACEP  CSN: [id number] MRN: [id number] ARRIVAL: 12/21/22 at 2156 ROOM: [facility]10/[facility]10   CHIEF COMPLAINT  Vaginal Discharge   HISTORY OF PRESENT ILLNESS  12/22/22 1:05 AM Molly Mcknight is a 37 y.o. female with 3 days of vaginal discharge with pelvic pain (6 out of 10) and discomfort with urination.    She is also constipated which is a chronic problem.  She would like a referral to gastroenterology.   Past Medical History:  Diagnosis Date   Anemia    Chlamydia    PID (pelvic inflammatory disease)    Trichimoniasis     Past Surgical History:  Procedure Laterality Date   AMPUTATION FINGER Right    CHOLECYSTECTOMY     TUBAL LIGATION      Family History  Problem Relation Age of Onset   Hypertension Mother    Diabetes Mother    Stroke Other    Diabetes Other    Hyperlipidemia Other     Social History   Tobacco Use   Smoking status: Former    Packs/day: .5    Types: Cigarettes    Quit date: 04/07/2020    Years since quitting: 2.7   Smokeless tobacco: Never  Vaping Use   Vaping Use: Former  Substance Use Topics   Alcohol use: Not Currently   Drug use: No    Prior to Admission medications   Medication Sig Start Date End Date Taking? Authorizing Provider  metroNIDAZOLE (FLAGYL) 500 MG tablet Take 1 tablet (500 mg total) by mouth 2 (two) times daily. One po bid x 7 days 12/22/22  Yes Dravyn Severs, MD  diphenhydrAMINE (BENADRYL) 25 MG tablet Take 1 tablet (25 mg total) by mouth every 6 (six) hours as needed. 12/24/20   Horton, Mayer Masker, MD  famotidine (PEPCID) 20 MG tablet Take 1 tablet (20 mg total) by mouth daily. 12/24/20   Horton, Mayer Masker, MD  furosemide (LASIX) 20 MG tablet Take 1 tablet (20 mg total) by mouth daily. 04/02/21 04/02/22  Elson Areas, PA-C  fluticasone (FLONASE) 50 MCG/ACT nasal spray Place 1 spray into both nostrils daily. 09/10/19 03/21/20  Tilden Fossa, MD    Allergies Amoxicillin  and Penicillins   REVIEW OF SYSTEMS  Negative except as noted here or in the History of Present Illness.   PHYSICAL EXAMINATION  Initial Vital Signs Blood pressure 107/65, pulse (!) 50, temperature 98.4 F (36.9 C), resp. rate 18, height 5\' 1"  (1.549 m), weight 63.5 kg, last menstrual period 12/07/2022, SpO2 100 %.  Examination General: Well-developed, well-nourished female in no acute distress; appearance consistent with age of record HENT: normocephalic; atraumatic Eyes: Normal appearance Neck: supple Heart: regular rate and rhythm Lungs: clear to auscultation bilaterally Abdomen: soft; nondistended; mild suprapubic tenderness; bowel sounds present GU: Normal external genitalia; no vulvovaginal inflammation; physiologic appearing vaginal discharge; no vaginal bleeding; minimal cervical motion tenderness; tender bladder Extremities: No deformity; full range of motion; pulses normal Neurologic: Awake, alert and oriented; motor function intact in all extremities and symmetric; no facial droop Skin: Warm and dry Psychiatric: Normal mood and affect   RESULTS  Summary of this visit's results, reviewed and interpreted by myself:   EKG Interpretation  Date/Time:    Ventricular Rate:    PR Interval:    QRS Duration:   QT Interval:    QTC Calculation:   R Axis:     Text Interpretation:  Laboratory Studies: Results for orders placed or performed during the hospital encounter of 12/22/22 (from the past 24 hour(s))  Urinalysis, Routine w reflex microscopic -Urine, Clean Catch     Status: Abnormal   Collection Time: 12/21/22 10:24 PM  Result Value Ref Range   Color, Urine YELLOW YELLOW   APPearance CLOUDY (A) CLEAR   Specific Gravity, Urine >=1.030 1.005 - 1.030   pH 6.0 5.0 - 8.0   Glucose, UA NEGATIVE NEGATIVE mg/dL   Hgb urine dipstick NEGATIVE NEGATIVE   Bilirubin Urine NEGATIVE NEGATIVE   Ketones, ur NEGATIVE NEGATIVE mg/dL   Protein, ur NEGATIVE NEGATIVE  mg/dL   Nitrite NEGATIVE NEGATIVE   Leukocytes,Ua SMALL (A) NEGATIVE  Pregnancy, urine     Status: None   Collection Time: 12/21/22 10:24 PM  Result Value Ref Range   Preg Test, Ur NEGATIVE NEGATIVE  CBC with Differential     Status: Abnormal   Collection Time: 12/21/22 10:24 PM  Result Value Ref Range   WBC 4.7 4.0 - 10.5 K/uL   RBC 3.67 (L) 3.87 - 5.11 MIL/uL   Hemoglobin 10.9 (L) 12.0 - 15.0 g/dL   HCT 16.1 (L) 09.6 - 04.5 %   MCV 90.5 80.0 - 100.0 fL   MCH 29.7 26.0 - 34.0 pg   MCHC 32.8 30.0 - 36.0 g/dL   RDW 40.9 81.1 - 91.4 %   Platelets 122 (L) 150 - 400 K/uL   nRBC 0.0 0.0 - 0.2 %   Neutrophils Relative % 75 %   Neutro Abs 3.5 1.7 - 7.7 K/uL   Lymphocytes Relative 16 %   Lymphs Abs 0.7 0.7 - 4.0 K/uL   Monocytes Relative 8 %   Monocytes Absolute 0.4 0.1 - 1.0 K/uL   Eosinophils Relative 1 %   Eosinophils Absolute 0.1 0.0 - 0.5 K/uL   Basophils Relative 0 %   Basophils Absolute 0.0 0.0 - 0.1 K/uL   Immature Granulocytes 0 %   Abs Immature Granulocytes 0.02 0.00 - 0.07 K/uL  Comprehensive metabolic panel     Status: Abnormal   Collection Time: 12/21/22 10:24 PM  Result Value Ref Range   Sodium 141 135 - 145 mmol/L   Potassium 3.8 3.5 - 5.1 mmol/L   Chloride 112 (H) 98 - 111 mmol/L   CO2 22 22 - 32 mmol/L   Glucose, Bld 116 (H) 70 - 99 mg/dL   BUN 21 (H) 6 - 20 mg/dL   Creatinine, Ser 7.82 0.44 - 1.00 mg/dL   Calcium 8.3 (L) 8.9 - 10.3 mg/dL   Total Protein 6.1 (L) 6.5 - 8.1 g/dL   Albumin 3.4 (L) 3.5 - 5.0 g/dL   AST 13 (L) 15 - 41 U/L   ALT 11 0 - 44 U/L   Alkaline Phosphatase 37 (L) 38 - 126 U/L   Total Bilirubin 0.3 0.3 - 1.2 mg/dL   GFR, Estimated >95 >62 mL/min   Anion gap 7 5 - 15  Urinalysis, Microscopic (reflex)     Status: Abnormal   Collection Time: 12/21/22 10:24 PM  Result Value Ref Range   RBC / HPF 0-5 0 - 5 RBC/hpf   WBC, UA 11-20 0 - 5 WBC/hpf   Bacteria, UA FEW (A) NONE SEEN   Squamous Epithelial / HPF 6-10 0 - 5 /HPF  Wet prep,  genital     Status: Abnormal   Collection Time: 12/22/22  1:22 AM  Result Value Ref Range   Yeast Wet Prep HPF POC NONE  SEEN NONE SEEN   Trich, Wet Prep NONE SEEN NONE SEEN   Clue Cells Wet Prep HPF POC PRESENT (A) NONE SEEN   WBC, Wet Prep HPF POC >=10 (A) <10   Sperm NONE SEEN    Imaging Studies: No results found.  ED COURSE and MDM  Nursing notes, initial and subsequent vitals signs, including pulse oximetry, reviewed and interpreted by myself.  Vitals:   12/21/22 2216 12/21/22 2218  BP: 107/65   Pulse: (!) 50   Resp: 18   Temp: 98.4 F (36.9 C)   SpO2: 100%   Weight:  63.5 kg  Height:  5\' 1"  (1.549 m)   Medications  fosfomycin (MONUROL) packet 3 g (has no administration in time range)    The patient has significant bladder tenderness as well as a urinalysis consistent with a urinary tract infection.  He is also having discomfort with urination.  Her discharge and wet prep are consistent with bacterial vaginosis.  Bacterial vaginosis usually does not cause pelvic pain or discomfort.  We will treat for UTI and bacterial vaginosis.  Gonorrhea, chlamydia and HIV testing are pending.  PROCEDURES  Procedures   ED DIAGNOSES     ICD-10-CM   1. Lower urinary tract infectious disease  N39.0     2. BV (bacterial vaginosis)  N76.0    B96.89          Leah Skora, Jonny Ruiz, MD 12/22/22 [phone]

## 2022-12-22 NOTE — ED Notes (Signed)
Pelvic exam done with EDP Pt did have some discomfort

## 2022-12-23 LAB — GC/CHLAMYDIA PROBE AMP (~~LOC~~) NOT AT ARMC
Chlamydia: NEGATIVE
Comment: NEGATIVE
Comment: NORMAL
Neisseria Gonorrhea: POSITIVE — AB

## 2022-12-23 LAB — URINE CULTURE: Culture: NO GROWTH

## 2023-06-01 ENCOUNTER — Emergency Department (HOSPITAL_BASED_OUTPATIENT_CLINIC_OR_DEPARTMENT_OTHER): Payer: PRIVATE HEALTH INSURANCE

## 2023-06-01 ENCOUNTER — Emergency Department (HOSPITAL_BASED_OUTPATIENT_CLINIC_OR_DEPARTMENT_OTHER)
Admission: EM | Admit: 2023-06-01 | Discharge: 2023-06-01 | Disposition: A | Discharge status: Home / Self Care | Payer: PRIVATE HEALTH INSURANCE | Attending: Emergency Medicine | Admitting: Emergency Medicine

## 2023-06-01 ENCOUNTER — Other Ambulatory Visit: Payer: Self-pay

## 2023-06-01 ENCOUNTER — Encounter (HOSPITAL_BASED_OUTPATIENT_CLINIC_OR_DEPARTMENT_OTHER): Payer: Self-pay | Admitting: Emergency Medicine

## 2023-06-01 DIAGNOSIS — N898 Other specified noninflammatory disorders of vagina: Secondary | ICD-10-CM | POA: Diagnosis not present

## 2023-06-01 DIAGNOSIS — R1030 Lower abdominal pain, unspecified: Secondary | ICD-10-CM | POA: Diagnosis present

## 2023-06-01 DIAGNOSIS — R102 Pelvic and perineal pain: Secondary | ICD-10-CM | POA: Insufficient documentation

## 2023-06-01 LAB — CBC WITH DIFFERENTIAL/PLATELET
Abs Immature Granulocytes: 0.01 10*3/uL (ref 0.00–0.07)
Basophils Absolute: 0 10*3/uL (ref 0.0–0.1)
Basophils Relative: 0 %
Eosinophils Absolute: 0.1 10*3/uL (ref 0.0–0.5)
Eosinophils Relative: 2 %
HCT: 36.2 % (ref 36.0–46.0)
Hemoglobin: 12.3 g/dL (ref 12.0–15.0)
Immature Granulocytes: 0 %
Lymphocytes Relative: 29 %
Lymphs Abs: 1.6 10*3/uL (ref 0.7–4.0)
MCH: 29.9 pg (ref 26.0–34.0)
MCHC: 34 g/dL (ref 30.0–36.0)
MCV: 88.1 fL (ref 80.0–100.0)
Monocytes Absolute: 0.3 10*3/uL (ref 0.1–1.0)
Monocytes Relative: 6 %
Neutro Abs: 3.5 10*3/uL (ref 1.7–7.7)
Neutrophils Relative %: 63 %
Platelets: 163 10*3/uL (ref 150–400)
RBC: 4.11 MIL/uL (ref 3.87–5.11)
RDW: 12.3 % (ref 11.5–15.5)
WBC: 5.6 10*3/uL (ref 4.0–10.5)
nRBC: 0 % (ref 0.0–0.2)

## 2023-06-01 LAB — URINALYSIS, W/ REFLEX TO CULTURE (INFECTION SUSPECTED)
Bilirubin Urine: NEGATIVE
Glucose, UA: NEGATIVE mg/dL
Ketones, ur: 15 mg/dL — AB
Leukocytes,Ua: NEGATIVE
Nitrite: NEGATIVE
Protein, ur: 30 mg/dL — AB
RBC / HPF: >50 RBC/hpf (ref 0–5)
Specific Gravity, Urine: 1.025 (ref 1.005–1.030)
pH: 5.5 (ref 5.0–8.0)

## 2023-06-01 LAB — COMPREHENSIVE METABOLIC PANEL
ALT: 22 U/L (ref 0–44)
AST: 23 U/L (ref 15–41)
Albumin: 4.2 g/dL (ref 3.5–5.0)
Alkaline Phosphatase: 34 U/L — ABNORMAL LOW (ref 38–126)
Anion gap: 9 (ref 5–15)
BUN: 18 mg/dL (ref 6–20)
CO2: 25 mmol/L (ref 22–32)
Calcium: 9.2 mg/dL (ref 8.9–10.3)
Chloride: 102 mmol/L (ref 98–111)
Creatinine, Ser: 0.93 mg/dL (ref 0.44–1.00)
GFR, Estimated: >60 mL/min (ref >60)
Glucose, Bld: 97 mg/dL (ref 70–99)
Potassium: 3.8 mmol/L (ref 3.5–5.1)
Sodium: 136 mmol/L (ref 135–145)
Total Bilirubin: 1.1 mg/dL (ref 0.3–1.2)
Total Protein: 6.6 g/dL (ref 6.5–8.1)

## 2023-06-01 LAB — PREGNANCY, URINE: Preg Test, Ur: NEGATIVE

## 2023-06-01 LAB — WET PREP, GENITAL
Clue Cells Wet Prep HPF POC: NONE SEEN
Sperm: NONE SEEN
Trich, Wet Prep: NONE SEEN
WBC, Wet Prep HPF POC: <10 (ref <10)
Yeast Wet Prep HPF POC: NONE SEEN

## 2023-06-01 LAB — HIV ANTIBODY (ROUTINE TESTING W REFLEX): HIV Screen 4th Generation wRfx: NONREACTIVE

## 2023-06-01 LAB — RPR: RPR Ser Ql: NONREACTIVE

## 2023-06-01 MED ORDER — DOXYCYCLINE HYCLATE 100 MG PO CAPS: 100.0000 mg | ORAL_CAPSULE | Freq: Two times a day (BID) | ORAL | 0 refills | Status: DC

## 2023-06-01 MED ORDER — KETOROLAC TROMETHAMINE 15 MG/ML IJ SOLN
15.0000 mg | Freq: Once | INTRAMUSCULAR | Status: AC
  Administered 2023-06-01: 15 mg via INTRAVENOUS
  Filled 2023-06-01: qty 1

## 2023-06-01 MED ORDER — ONDANSETRON 4 MG PO TBDP: 4.0000 mg | ORAL_TABLET | Freq: Three times a day (TID) | ORAL | 0 refills | Status: DC | PRN

## 2023-06-01 MED ORDER — SODIUM CHLORIDE 0.9 % IV SOLN
1.0000 g | Freq: Once | INTRAVENOUS | Status: AC
  Administered 2023-06-01: 1 g via INTRAVENOUS
  Filled 2023-06-01: qty 10

## 2023-06-01 MED ORDER — METRONIDAZOLE 500 MG PO TABS: 500.0000 mg | ORAL_TABLET | Freq: Two times a day (BID) | ORAL | 0 refills | Status: DC

## 2023-06-01 MED ORDER — SODIUM CHLORIDE 0.9 % IV SOLN: INTRAVENOUS | Status: DC | PRN

## 2023-06-01 NOTE — ED Provider Notes (Addendum)
[place] [facility] AT [facility] Provider Note   CSN: [id number] Arrival date & time: 06/01/23  0126     History  Chief Complaint  Patient presents with   Flank Pain    Vag D/C    Molly Mcknight is a 37 y.o. female.  The history is provided by the patient.  Flank Pain  Molly Mcknight is a 37 y.o. female who presents to the Emergency Department complaining of abdominal pain and flank pain.  She presents to the emergency department complaining of lower abdominal cramping as well as left flank pain that started on Thursday.  She states that this is unusual and not similar to her prior menstrual cramps.  Pain is constant in nature.  She also reports dysuria and difficulty urinating.  She feels like she has to push to get the urine out.  She has nausea.  No fever, vomiting, diarrhea.  She also does report some light brown discharge that started tonight.  No prior similar sxs.   Lmp 9/13  Sexually active. No new partners.   Hx/o tubal ligation.         Home Medications Prior to Admission medications   Medication Sig Start Date End Date Taking? Authorizing Provider  doxycycline (VIBRAMYCIN) 100 MG capsule Take 1 capsule (100 mg total) by mouth 2 (two) times daily. 06/01/23  Yes Tilden Fossa, MD  metroNIDAZOLE (FLAGYL) 500 MG tablet Take 1 tablet (500 mg total) by mouth 2 (two) times daily. 06/01/23  Yes Tilden Fossa, MD  ondansetron (ZOFRAN-ODT) 4 MG disintegrating tablet Take 1 tablet (4 mg total) by mouth every 8 (eight) hours as needed. 06/01/23  Yes Tilden Fossa, MD  diphenhydrAMINE (BENADRYL) 25 MG tablet Take 1 tablet (25 mg total) by mouth every 6 (six) hours as needed. 12/24/20   Horton, Mayer Masker, MD  famotidine (PEPCID) 20 MG tablet Take 1 tablet (20 mg total) by mouth daily. 12/24/20   Horton, Mayer Masker, MD  furosemide (LASIX) 20 MG tablet Take 1 tablet (20 mg total) by mouth daily. 04/02/21 04/02/22  Elson Areas, PA-C  fluticasone  (FLONASE) 50 MCG/ACT nasal spray Place 1 spray into both nostrils daily. 09/10/19 03/21/20  Tilden Fossa, MD      Allergies    Amoxicillin and Penicillins    Review of Systems   Review of Systems  Genitourinary:  Positive for flank pain.  All other systems reviewed and are negative.   Physical Exam Updated Vital Signs BP 111/76 (BP Location: Right Arm)   Pulse (!) 59   Temp 98.1 F (36.7 C) (Oral)   Resp 16   LMP 05/11/2023   SpO2 100%  Physical Exam Vitals and nursing note reviewed.  Constitutional:      Appearance: She is well-developed.  HENT:     Head: Normocephalic and atraumatic.  Cardiovascular:     Rate and Rhythm: Normal rate and regular rhythm.  Pulmonary:     Effort: Pulmonary effort is normal. No respiratory distress.  Abdominal:     Palpations: Abdomen is soft.     Tenderness: There is left CVA tenderness. There is no guarding or rebound.     Comments: Mild lower abdominal tenderness  Genitourinary:    Comments: Small amount of brown vaginal discharge consistent with old blood products.  There is CMT. Musculoskeletal:        General: No swelling or tenderness.  Skin:    General: Skin is warm and dry.  Neurological:  Mental Status: She is alert and oriented to person, place, and time.  Psychiatric:        Behavior: Behavior normal.     ED Results / Procedures / Treatments   Labs (all labs ordered are listed, but only abnormal results are displayed) Labs Reviewed  URINALYSIS, W/ REFLEX TO CULTURE (INFECTION SUSPECTED) - Abnormal; Notable for the following components:      Result Value   APPearance CLOUDY (*)    Hgb urine dipstick LARGE (*)    Ketones, ur 15 (*)    Protein, ur 30 (*)    Bacteria, UA FEW (*)    All other components within normal limits  COMPREHENSIVE METABOLIC PANEL - Abnormal; Notable for the following components:   Alkaline Phosphatase 34 (*)    All other components within normal limits  WET PREP, GENITAL  PREGNANCY,  URINE  CBC WITH DIFFERENTIAL/PLATELET  RPR  HIV ANTIBODY (ROUTINE TESTING W REFLEX)  GC/CHLAMYDIA PROBE AMP ([place]) NOT AT [facility]    EKG None  Radiology CT Renal Stone Study  Result Date: 06/01/2023 CLINICAL DATA:  Abdominal pain.  Urinary frequency. EXAM: CT ABDOMEN AND PELVIS WITHOUT CONTRAST TECHNIQUE: Multidetector CT imaging of the abdomen and pelvis was performed following the standard protocol without IV contrast. RADIATION DOSE REDUCTION: This exam was performed according to the departmental dose-optimization program which includes automated exposure control, adjustment of the mA and/or kV according to patient size and/or use of iterative reconstruction technique. COMPARISON:  CT abdomen pelvis dated 05/18/2021. FINDINGS: Evaluation of this exam is limited in the absence of intravenous contrast. Lower chest: The visualized lung bases are clear. No intra-abdominal free air or free fluid. Hepatobiliary: The liver is unremarkable. No biliary dilatation. Cholecystectomy. Pancreas: Unremarkable. No pancreatic ductal dilatation or surrounding inflammatory changes. Spleen: Normal in size without focal abnormality. Adrenals/Urinary Tract: The adrenal glands are unremarkable. Several small nonobstructing bilateral renal calculi measure up to 2-3 mm. There is no hydronephrosis or obstructing stone on either side. A 4 mm calcific focus in the right hemipelvis (62/301) is new since the prior CT. Although this may represent soft tissue calcification, a distal ureteral stone is not excluded. The urinary bladder is unremarkable. Stomach/Bowel: There is moderate stool throughout the colon. There is no bowel obstruction or active inflammation. The appendix is normal. Vascular/Lymphatic: The abdominal aorta and IVC are unremarkable on this noncontrast CT. No portal venous gas. There is no adenopathy. Reproductive: The uterus is anteverted and grossly unremarkable. No adnexal masses Other: None  Musculoskeletal: Bilateral L5 pars defects with grade 1 anterolisthesis. No acute osseous pathology. IMPRESSION: 1. Small nonobstructing bilateral renal calculi. No hydronephrosis or obstructing stone. 2. A 4 mm probable soft tissue calcification versus less likely a distal right ureteral calculus. 3. No bowel obstruction. Normal appendix. Electronically Signed   By: Elgie Collard M.D.   On: 06/01/2023 03:51    Procedures Procedures    Medications Ordered in ED Medications  cefTRIAXone (ROCEPHIN) 1 g in sodium chloride 0.9 % 100 mL IVPB (1 g Intravenous New Bag/Given 06/01/23 0501)  0.9 %  sodium chloride infusion (has no administration in time range)  ketorolac (TORADOL) 15 MG/ML injection 15 mg (15 mg Intravenous Given 06/01/23 7846)    ED Course/ Medical Decision Making/ A&P                                 Medical Decision Making Amount and/or Complexity of  Data Reviewed Labs: ordered. Radiology: ordered.  Risk Prescription drug management.   Patient here for evaluation of lower abdominal pain, left flank pain for several days as well as brown vaginal discharge that started tonight.  She does have tenderness on examination without peritoneal findings.  UA is equivocal for urinary tract infection.  CT stone study is negative for definite stone.  There is a possible right ureteral stone although she does not have any pain referred to this region, feel this is less likely.  On examination she does have brown vaginal discharge, CMT.  Concern for possible PID.  There was no pelvic mass identified on CT scan, feel TOA is less likely.  Will start empiric treatment for PID given her symptoms.    Plan to obtain pelvic ultrasound to further evaluate for cyst, early TOA.  Patient care transferred pending pelvic ultrasound.  Current clinical picture is not consistent with sepsis, infected kidney stone, torsion, appendicitis.        Final Clinical Impression(s) / ED Diagnoses Final  diagnoses:  Pelvic pain    Rx / DC Orders ED Discharge Orders          Ordered    doxycycline (VIBRAMYCIN) 100 MG capsule  2 times daily        06/01/23 0445    metroNIDAZOLE (FLAGYL) 500 MG tablet  2 times daily        06/01/23 0445    ondansetron (ZOFRAN-ODT) 4 MG disintegrating tablet  Every 8 hours PRN        06/01/23 0445    US PELVIC COMPLETE W TRANSVAGINAL AND TORSION R/O        06/01/23 0446              Tilden Fossa, MD 06/01/23 8657    Tilden Fossa, MD 06/01/23 [phone]

## 2023-06-01 NOTE — ED Provider Notes (Signed)
Ultrasound without any acute findings.  No evidence of torsion.  No evidence of tubo-ovarian abscess.   Vanetta Mulders, MD 06/01/23 [phone]

## 2023-06-01 NOTE — ED Notes (Signed)
ED Provider at bedside. 

## 2023-06-01 NOTE — ED Notes (Addendum)
US at bedside

## 2023-06-01 NOTE — ED Triage Notes (Signed)
States has been having lower abd pain since Thursday, radiates to  left flank. Increase frequency in urination. Brownish vag d/c

## 2023-06-01 NOTE — ED Notes (Signed)
Unable to provide u/a at this time 

## 2023-06-01 NOTE — Discharge Instructions (Addendum)
Ultrasound without any acute findings.  [facility].

## 2023-06-02 LAB — GC/CHLAMYDIA PROBE AMP (~~LOC~~) NOT AT ARMC
Chlamydia: NEGATIVE
Comment: NEGATIVE
Comment: NORMAL
Neisseria Gonorrhea: NEGATIVE

## 2024-01-20 ENCOUNTER — Other Ambulatory Visit: Payer: Self-pay

## 2024-01-20 ENCOUNTER — Encounter (HOSPITAL_BASED_OUTPATIENT_CLINIC_OR_DEPARTMENT_OTHER): Payer: Self-pay | Admitting: Emergency Medicine

## 2024-01-20 ENCOUNTER — Emergency Department (HOSPITAL_BASED_OUTPATIENT_CLINIC_OR_DEPARTMENT_OTHER)

## 2024-01-20 ENCOUNTER — Emergency Department (HOSPITAL_BASED_OUTPATIENT_CLINIC_OR_DEPARTMENT_OTHER)
Admission: EM | Admit: 2024-01-20 | Discharge: 2024-01-20 | Disposition: A | Discharge status: Home / Self Care | Attending: Emergency Medicine | Admitting: Emergency Medicine

## 2024-01-20 DIAGNOSIS — R112 Nausea with vomiting, unspecified: Secondary | ICD-10-CM | POA: Diagnosis not present

## 2024-01-20 DIAGNOSIS — K769 Liver disease, unspecified: Secondary | ICD-10-CM

## 2024-01-20 DIAGNOSIS — Z87891 Personal history of nicotine dependence: Secondary | ICD-10-CM | POA: Diagnosis not present

## 2024-01-20 DIAGNOSIS — R1084 Generalized abdominal pain: Secondary | ICD-10-CM | POA: Diagnosis not present

## 2024-01-20 DIAGNOSIS — R110 Nausea: Secondary | ICD-10-CM

## 2024-01-20 DIAGNOSIS — K7689 Other specified diseases of liver: Secondary | ICD-10-CM | POA: Diagnosis not present

## 2024-01-20 DIAGNOSIS — R1013 Epigastric pain: Secondary | ICD-10-CM | POA: Diagnosis present

## 2024-01-20 LAB — COMPREHENSIVE METABOLIC PANEL WITH GFR
ALT: 17 U/L (ref 0–44)
AST: 19 U/L (ref 15–41)
Albumin: 4.1 g/dL (ref 3.5–5.0)
Alkaline Phosphatase: 45 U/L (ref 38–126)
Anion gap: 9 (ref 5–15)
BUN: 11 mg/dL (ref 6–20)
CO2: 23 mmol/L (ref 22–32)
Calcium: 9 mg/dL (ref 8.9–10.3)
Chloride: 106 mmol/L (ref 98–111)
Creatinine, Ser: 0.73 mg/dL (ref 0.44–1.00)
GFR, Estimated: >60 mL/min (ref >60)
Glucose, Bld: 89 mg/dL (ref 70–99)
Potassium: 4 mmol/L (ref 3.5–5.1)
Sodium: 138 mmol/L (ref 135–145)
Total Bilirubin: 0.6 mg/dL (ref 0.0–1.2)
Total Protein: 6.5 g/dL (ref 6.5–8.1)

## 2024-01-20 LAB — CBC
HCT: 34.9 % — ABNORMAL LOW (ref 36.0–46.0)
Hemoglobin: 12 g/dL (ref 12.0–15.0)
MCH: 30.5 pg (ref 26.0–34.0)
MCHC: 34.4 g/dL (ref 30.0–36.0)
MCV: 88.6 fL (ref 80.0–100.0)
Platelets: 141 10*3/uL — ABNORMAL LOW (ref 150–400)
RBC: 3.94 MIL/uL (ref 3.87–5.11)
RDW: 13.1 % (ref 11.5–15.5)
WBC: 4.5 10*3/uL (ref 4.0–10.5)
nRBC: 0 % (ref 0.0–0.2)

## 2024-01-20 LAB — URINALYSIS, ROUTINE W REFLEX MICROSCOPIC
Bilirubin Urine: NEGATIVE
Glucose, UA: NEGATIVE mg/dL
Ketones, ur: NEGATIVE mg/dL
Nitrite: NEGATIVE
Protein, ur: NEGATIVE mg/dL
Specific Gravity, Urine: 1.025 (ref 1.005–1.030)
pH: 5.5 (ref 5.0–8.0)

## 2024-01-20 LAB — URINALYSIS, MICROSCOPIC (REFLEX)

## 2024-01-20 LAB — TROPONIN T, HIGH SENSITIVITY: Troponin T High Sensitivity: <15 ng/L (ref <19)

## 2024-01-20 LAB — PREGNANCY, URINE: Preg Test, Ur: NEGATIVE

## 2024-01-20 LAB — LIPASE, BLOOD: Lipase: 22 U/L (ref 11–51)

## 2024-01-20 MED ORDER — POLYETHYLENE GLYCOL 3350 17 G PO PACK: 17.0000 g | PACK | Freq: Every day | ORAL | 1 refills | Status: AC

## 2024-01-20 MED ORDER — SODIUM CHLORIDE 0.9 % IV BOLUS
1000.0000 mL | Freq: Once | INTRAVENOUS | Status: AC
  Administered 2024-01-20: 1000 mL via INTRAVENOUS

## 2024-01-20 MED ORDER — DICYCLOMINE HCL 20 MG PO TABS: 20.0000 mg | ORAL_TABLET | Freq: Two times a day (BID) | ORAL | 0 refills | Status: AC

## 2024-01-20 MED ORDER — CEPHALEXIN 500 MG PO CAPS: 500.0000 mg | ORAL_CAPSULE | Freq: Three times a day (TID) | ORAL | 0 refills | Status: AC

## 2024-01-20 MED ORDER — IOHEXOL 300 MG/ML  SOLN
100.0000 mL | Freq: Once | INTRAMUSCULAR | Status: AC | PRN
  Administered 2024-01-20: 100 mL via INTRAVENOUS

## 2024-01-20 MED ORDER — ONDANSETRON 4 MG PO TBDP: 4.0000 mg | ORAL_TABLET | Freq: Three times a day (TID) | ORAL | 0 refills | Status: AC | PRN

## 2024-01-20 MED ORDER — ONDANSETRON HCL 4 MG/2ML IJ SOLN
4.0000 mg | Freq: Once | INTRAMUSCULAR | Status: AC | PRN
  Administered 2024-01-20: 4 mg via INTRAVENOUS
  Filled 2024-01-20: qty 2

## 2024-01-20 NOTE — ED Provider Notes (Signed)
 [facility] Emergency Department Provider Note MRN:  [id number]  Arrival date & time: 01/20/24     Chief Complaint   Emesis and Abdominal Pain   History of Present Illness   Molly Mcknight is a 38 y.o. year-old female with no pertinent past medical history presenting to the ED with chief complaint of emesis and abdominal pain.  Malaise fatigue and abdominal discomfort for the past week.  Now having nausea vomiting for the past 2 days, feels like something is wrong, generally feels terrible.  Normal bowel movements.  Some epigastric and lower chest pain.  Review of Systems  A thorough review of systems was obtained and all systems are negative except as noted in the HPI and PMH.   Patient's Health History    Past Medical History:  Diagnosis Date   Anemia    Chlamydia    PID (pelvic inflammatory disease)    Trichimoniasis     Past Surgical History:  Procedure Laterality Date   AMPUTATION FINGER Right    CHOLECYSTECTOMY     TUBAL LIGATION      Family History  Problem Relation Age of Onset   Hypertension Mother    Diabetes Mother    Stroke Other    Diabetes Other    Hyperlipidemia Other     Social History   Socioeconomic History   Marital status: Single    Spouse name: Not on file   Number of children: Not on file   Years of education: Not on file   Highest education level: Not on file  Occupational History   Not on file  Tobacco Use   Smoking status: Former    Current packs/day: 0.00    Types: Cigarettes    Quit date: 04/07/2020    Years since quitting: 3.7   Smokeless tobacco: Never  Vaping Use   Vaping status: Every Day  Substance and Sexual Activity   Alcohol use: Not Currently   Drug use: No   Sexual activity: Not on file  Other Topics Concern   Not on file  Social History Narrative   Not on file   Social Drivers of Health   Financial Resource Strain: Low Risk  (11/03/2023)   Received from [facility]   Overall  Financial Resource Strain (CARDIA)    Difficulty of Paying Living Expenses: Not hard at all  Food Insecurity: No Food Insecurity (11/03/2023)   Received from [facility]   Hunger Vital Sign    Worried About Running Out of Food in the Last Year: Never true    Ran Out of Food in the Last Year: Never true  Transportation Needs: No Transportation Needs (11/03/2023)   Received from [facility] - Transportation    Lack of Transportation (Medical): No    Lack of Transportation (Non-Medical): No  Physical Activity: Insufficiently Active (05/03/2023)   Received from [facility]   Exercise Vital Sign    Days of Exercise per Week: 2 days    Minutes of Exercise per Session: 30 min  Stress: No Stress Concern Present (05/03/2023)   Received from [facility] of Occupational Health - Occupational Stress Questionnaire    Feeling of Stress : Not at all  Social Connections: Socially Integrated (05/03/2023)   Received from [facility]   Social Network    How would you rate your social network (family, work, friends)?: Good participation with social networks  Intimate Partner Violence: Not At Risk (06/07/2023)  Received from [facility]   HITS    Over the last 12 months how often did your partner physically hurt you?: Never    Over the last 12 months how often did your partner insult you or talk down to you?: Never    Over the last 12 months how often did your partner threaten you with physical harm?: Never    Over the last 12 months how often did your partner scream or curse at you?: Never     Physical Exam   Vitals:   01/20/24 0336  BP: 115/77  Pulse: (!) 56  Resp: 16  Temp: (!) 97.5 F (36.4 C)  SpO2: 100%    CONSTITUTIONAL: Well-appearing, NAD NEURO/PSYCH:  Alert and oriented x 3, no focal deficits EYES:  eyes equal and reactive ENT/NECK:  no LAD, no JVD CARDIO: Regular rate, well-perfused, normal S1 and S2 PULM:  CTAB no wheezing or rhonchi GI/GU:   non-distended, non-tender MSK/SPINE:  No gross deformities, no edema SKIN:  no rash, atraumatic   *Additional and/or pertinent findings included in MDM below  Diagnostic and Interventional Summary    EKG Interpretation Date/Time:  Thursday Jan 20 2024 04:51:46 EDT Ventricular Rate:  50 PR Interval:  109 QRS Duration:  89 QT Interval:  476 QTC Calculation: 435 R Axis:   59  Text Interpretation: Sinus rhythm Short PR interval Low voltage, precordial leads Confirmed by Gwenetta Lennert [id number]) on 01/20/2024 6:03:26 AM       Labs Reviewed  CBC - Abnormal; Notable for the following components:      Result Value   HCT 34.9 (*)    Platelets 141 (*)    All other components within normal limits  URINALYSIS, ROUTINE W REFLEX MICROSCOPIC - Abnormal; Notable for the following components:   APPearance HAZY (*)    Hgb urine dipstick MODERATE (*)    Leukocytes,Ua TRACE (*)    All other components within normal limits  URINALYSIS, MICROSCOPIC (REFLEX) - Abnormal; Notable for the following components:   Bacteria, UA MANY (*)    All other components within normal limits  LIPASE, BLOOD  COMPREHENSIVE METABOLIC PANEL WITH GFR  PREGNANCY, URINE  TROPONIN T, HIGH SENSITIVITY    CT ABDOMEN PELVIS W CONTRAST  Final Result      Medications  ondansetron  (ZOFRAN ) injection 4 mg (4 mg Intravenous Given 01/20/24 0411)  sodium chloride  0.9 % bolus 1,000 mL (1,000 mLs Intravenous New Bag/Given 01/20/24 0454)  iohexol  (OMNIPAQUE ) 300 MG/ML solution 100 mL (100 mLs Intravenous Contrast Given 01/20/24 0527)     Procedures  /  Critical Care Procedures  ED Course and Medical Decision Making  Initial Impression and Ddx Differential diagnosis includes gastroenteritis, diverticulitis, appendicitis, pancreatitis, UTI, pyelonephritis, kidney stone.  Past medical/surgical history that increases complexity of ED encounter: None  Interpretation of Diagnostics I personally reviewed the EKG and my  interpretation is as follows: Sinus rhythm without ischemic findings  No significant blood count or electrolyte disturbance.  Patient Reassessment and Ultimate Disposition/Management     CT revealing inflamed jejunum, likely enteritis.  Urinalysis favored to be contaminated however patient is having some dysuria and so will empirically treat, sent for culture.  Appropriate for discharge.  Patient management required discussion with the following services or consulting groups:  None  Complexity of Problems Addressed Acute illness or injury that poses threat of life of bodily function  Additional Data Reviewed and Analyzed Further history obtained from: Prior labs/imaging results  Additional Factors Impacting ED Encounter  Risk Use of parenteral controlled substances and Consideration of hospitalization  Merrick Abe. Harless Lien, MD [facility] [email or web address]  Final Clinical Impressions(s) / ED Diagnoses     ICD-10-CM   1. Generalized abdominal pain  R10.84     2. Nausea  R11.0     3. Liver lesion  K76.9       ED Discharge Orders          Ordered    ondansetron  (ZOFRAN -ODT) 4 MG disintegrating tablet  Every 8 hours PRN        01/20/24 0609    dicyclomine (BENTYL) 20 MG tablet  2 times daily        01/20/24 0609    cephALEXin  (KEFLEX ) 500 MG capsule  3 times daily        01/20/24 0609             Discharge Instructions Discussed with and Provided to Patient:     Discharge Instructions      You were evaluated in the Emergency Department and after careful evaluation, we did not find any emergent condition requiring admission or further testing in the hospital.  Your exam/testing today is overall reassuring.  Symptoms seem to be due to a stomach bug or gastroenteritis based on your CT scan.  You may also have a UTI.  Use Zofran  as needed for nausea.  Use the Bentyl as needed for crampy abdominal pain.  Use the Keflex   antibiotic as directed to clear any urinary infection.  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  Please discuss this with your primary care doctor.  Please return to the Emergency Department if you experience any worsening of your condition.   Thank you for allowing us  to be a part of your care.     Edson Graces, MD 01/20/24 [phone]

## 2024-01-20 NOTE — ED Notes (Signed)
 Pt ambulated to bathroom with no assistance to provide urine sample

## 2024-01-20 NOTE — ED Triage Notes (Signed)
 Nausea X 1 week. Emesis and abdominal pain for last 2 nights, with body aches and chest pain.

## 2024-01-20 NOTE — ED Notes (Signed)
 Upon assessment, pt denies chest pain. Reports breasts are tender to palpation however denies pain to internal chest.

## 2024-01-20 NOTE — Discharge Instructions (Addendum)
 You were evaluated in the Emergency Department and after careful evaluation, we did not find any emergent condition requiring admission or further testing in the hospital.  Your exam/testing today is overall reassuring.  Symptoms seem to be due to a stomach bug or gastroenteritis based on your CT scan.  You may also have a UTI.  Use Zofran  as needed for nausea.  Use the Bentyl as needed for crampy abdominal pain.  Use the Keflex  antibiotic as directed to clear any urinary infection.  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  Please discuss this with your primary care doctor.  Please return to the Emergency Department if you experience any worsening of your condition.   Thank you for allowing us  to be a part of your care.

## 2024-02-28 ENCOUNTER — Emergency Department (HOSPITAL_BASED_OUTPATIENT_CLINIC_OR_DEPARTMENT_OTHER)
Admission: EM | Admit: 2024-02-28 | Discharge: 2024-02-28 | Disposition: A | Discharge status: Home / Self Care | Attending: Emergency Medicine | Admitting: Emergency Medicine

## 2024-02-28 ENCOUNTER — Encounter (HOSPITAL_BASED_OUTPATIENT_CLINIC_OR_DEPARTMENT_OTHER): Payer: Self-pay | Admitting: Emergency Medicine

## 2024-02-28 ENCOUNTER — Other Ambulatory Visit: Payer: Self-pay

## 2024-02-28 DIAGNOSIS — H538 Other visual disturbances: Secondary | ICD-10-CM | POA: Diagnosis present

## 2024-02-28 DIAGNOSIS — H1033 Unspecified acute conjunctivitis, bilateral: Secondary | ICD-10-CM | POA: Insufficient documentation

## 2024-02-28 MED ORDER — ERYTHROMYCIN 5 MG/GM OP OINT
TOPICAL_OINTMENT | Freq: Four times a day (QID) | OPHTHALMIC | Status: DC
  Administered 2024-02-28: 1 via OPHTHALMIC
  Filled 2024-02-28: qty 3.5

## 2024-02-28 NOTE — ED Notes (Signed)
 Pt can not see any letters on the visual acuity test. Pt does not normally wear glasses

## 2024-02-28 NOTE — ED Provider Notes (Signed)
 [place] [facility]GENCY DE[facility]MENT AT [facility]  Provider Note  CSN: [id number] Arrival date & time: 02/28/24 0128  History Chief Complaint  Patient presents with   Eye Problem    Molly Mcknight is a 38 y.o. female with no significant PMH reports she has had blurry vision and stingy mucous from both eyes since being in the pool yesterday. She has tried rinsing them out. She reports she was able to drive here, but unable to work due to vision changes.    Home Medications Prior to Admission medications   Medication Sig Start Date End Date Taking? Authorizing Provider  dicyclomine  (BENTYL ) 20 MG tablet Take 1 tablet (20 mg total) by mouth 2 (two) times daily. 01/20/24   Theadore Ozell HERO, MD  furosemide  (LASIX ) 20 MG tablet Take 1 tablet (20 mg total) by mouth daily. 04/02/21 04/02/22  Sofia, Leslie K, PA-C  ondansetron  (ZOFRAN -ODT) 4 MG disintegrating tablet Take 1 tablet (4 mg total) by mouth every 8 (eight) hours as needed for nausea or vomiting. 01/20/24   Theadore Ozell HERO, MD  polyethylene glycol (MIRALAX ) 17 g packet Take 17 g by mouth daily. 01/20/24   Theadore Ozell HERO, MD  fluticasone  (FLONASE ) 50 MCG/ACT nasal spray Place 1 spray into both nostrils daily. 09/10/19 03/21/20  Griselda Norris, MD     Allergies    Amoxicillin  and Penicillins   Review of Systems   Review of Systems Please see HPI for pertinent positives and negatives  Physical Exam BP 115/80 (BP Location: Left Arm)   Pulse 61   Temp 98.3 F (36.8 C) (Oral)   Resp 20   Ht 5' (1.524 m)   Wt 65.8 kg   LMP 02/22/2024   SpO2 97%   BMI 28.32 kg/m   Physical Exam Vitals and nursing note reviewed.  HENT:     Head: Normocephalic.     Nose: Nose normal.  Eyes:     Extraocular Movements: Extraocular movements intact.     Conjunctiva/sclera: Conjunctivae normal.     Pupils: Pupils are equal, round, and reactive to light.     Comments: Anterior chambers are clear, no purulent drainage. Visual acuity is  inconsistent. Limited undilated fundoscopic exam is normal.   Pulmonary:     Effort: Pulmonary effort is normal.  Musculoskeletal:        General: Normal range of motion.     Cervical back: Neck supple.  Skin:    Findings: No rash (on exposed skin).  Neurological:     Mental Status: She is alert and oriented to person, place, and time.  Psychiatric:        Mood and Affect: Mood normal.     ED Results / Procedures / Treatments   EKG None  Procedures Procedures  Medications Ordered in the ED Medications  erythromycin  ophthalmic ointment (has no administration in time range)    Initial Impression and Plan  Patient here with reported blurry vision and eye irritation. Her exam is grossly normal. Suspect a chemical conjunctivitis from pool water, no signs of infection. Will give erythromycin  ointment for protection/soothing. Recommend Ophtho follow up for further evaluation and more detailed examination. No concern for acute life or vision threatening process at this time.   ED Course       MDM Rules/Calculators/A&P Medical Decision Making Problems Addressed: Acute conjunctivitis of both eyes, unspecified acute conjunctivitis type: acute illness or injury  Risk Prescription drug management.     Final Clinical Impression(s) / ED  Diagnoses Final diagnoses:  Acute conjunctivitis of both eyes, unspecified acute conjunctivitis type    Rx / DC Orders ED Discharge Orders     None        Roselyn Carlin NOVAK, MD 02/28/24 [phone]

## 2024-02-28 NOTE — ED Triage Notes (Signed)
 Eye irritation and blurry vision that started yesterday at the pool. Pt states it is both eyes. No exposure that she is aware of. States she has gotten stringy mucous out of them. She has tried rinsing them out with saline at home without relief.
